# Patient Record
Sex: Male | Born: 1975 | Race: White | Hispanic: Yes | Marital: Married | Smoking: Never smoker
Health system: Southern US, Community
[De-identification: ages and names within clinical notes are randomized; demographics above are authoritative.]

## PROBLEM LIST (undated history)

## (undated) DIAGNOSIS — E785 Hyperlipidemia, unspecified: Secondary | ICD-10-CM

## (undated) HISTORY — DX: Hyperlipidemia, unspecified: E78.5

---

## 2009-04-12 ENCOUNTER — Emergency Department (HOSPITAL_COMMUNITY): Admission: EM | Admit: 2009-04-12 | Discharge: 2009-04-12 | Payer: Self-pay | Admitting: Emergency Medicine

## 2009-04-13 ENCOUNTER — Emergency Department (HOSPITAL_COMMUNITY): Admission: EM | Admit: 2009-04-13 | Discharge: 2009-04-13 | Payer: Self-pay | Admitting: Emergency Medicine

## 2012-02-17 ENCOUNTER — Encounter (HOSPITAL_COMMUNITY): Payer: Self-pay | Admitting: Emergency Medicine

## 2012-02-17 ENCOUNTER — Emergency Department (HOSPITAL_COMMUNITY): Payer: Self-pay

## 2012-02-17 ENCOUNTER — Emergency Department (HOSPITAL_COMMUNITY)
Admission: EM | Admit: 2012-02-17 | Discharge: 2012-02-17 | Disposition: A | Discharge status: Home / Self Care | Payer: Self-pay | Attending: Emergency Medicine | Admitting: Emergency Medicine

## 2012-02-17 DIAGNOSIS — S61209A Unspecified open wound of unspecified finger without damage to nail, initial encounter: Secondary | ICD-10-CM | POA: Insufficient documentation

## 2012-02-17 DIAGNOSIS — T148XXA Other injury of unspecified body region, initial encounter: Secondary | ICD-10-CM

## 2012-02-17 DIAGNOSIS — W278XXA Contact with other nonpowered hand tool, initial encounter: Secondary | ICD-10-CM | POA: Insufficient documentation

## 2012-02-17 MED ORDER — CEPHALEXIN 500 MG PO CAPS: 500.0000 mg | ORAL_CAPSULE | Freq: Three times a day (TID) | ORAL | Status: AC

## 2012-02-17 NOTE — ED Notes (Signed)
Pt d/c home. Verbalized understanding of need to complete full dose of antibiotics and warning sings indicating need to seek further treatment.  Respirations even and regular. Skin warm, dry, and intact. A.o. X4. Ambulatory. NAD. No further questions at this time. Family at bedside.

## 2012-02-17 NOTE — ED Provider Notes (Signed)
Medical screening examination/treatment/procedure(s) were performed by non-physician practitioner and as supervising physician I was immediately available for consultation/collaboration.  Larry Alcock K Naomia Lenderman, MD 02/17/12 0644 

## 2012-02-17 NOTE — ED Notes (Signed)
Pt reports about 5pm was working with nail gun and it shot his L middle finger; nail no longer in finger; pt reports just punctured tip of finger; pt now pain at site

## 2012-02-17 NOTE — ED Provider Notes (Signed)
History     CSN: [id number]  Arrival date & time 02/17/12  0018   First MD Initiated Contact with Patient 02/17/12 0300      Chief Complaint  Patient presents with  . Finger Injury    (Consider location/radiation/quality/duration/timing/severity/associated sxs/prior treatment) HPI Comments: Patient presents emergency department with chief complaint of left middle finger pain.  Onset of pain began approximately at 5 p.m., patient states that he accidentally shot his finger pad with a nail gun and reports pain at site and patient reports swelling, erythema, and pain at the distal tip of his digit gradually worsening since the incident.  Patient denies fever, night sweats, chills.  No other complaints this time.tetanus status up-to-date  The history is provided by the patient.    History reviewed. No pertinent past medical history.  History reviewed. No pertinent past surgical history.  History reviewed. No pertinent family history.  History  Substance Use Topics  . Smoking status: Never Smoker   . Smokeless tobacco: Not on file  . Alcohol Use: No      Review of Systems  Constitutional: Negative for fever, diaphoresis and activity change.  HENT: Negative for congestion and neck pain.   Respiratory: Negative for cough.   Genitourinary: Negative for dysuria.  Musculoskeletal: Negative for myalgias.  Skin: Positive for wound. Negative for color change.  Neurological: Negative for headaches.  All other systems reviewed and are negative.    Allergies  Review of patient's allergies indicates no known allergies.  Home Medications  No current outpatient prescriptions on file.  BP 117/66  Pulse 62  Temp 98 F (36.7 C) (Oral)  Resp 16  SpO2 98%  Physical Exam  Nursing note and vitals reviewed. Constitutional: He is oriented to person, place, and time. He appears well-developed and well-nourished. No distress.  HENT:  Head: Normocephalic and atraumatic.  Eyes:  Conjunctivae and EOM are normal.  Neck: Normal range of motion.  Cardiovascular:       Intact distal pulses, cap refill < 3 sec  Pulmonary/Chest: Effort normal.  Musculoskeletal: Normal range of motion.       Mild pain w flexion extension of DIP of L 3rd digit.   Neurological: He is alert and oriented to person, place, and time.  Skin: Skin is warm and dry. No rash noted. He is not diaphoretic.       3 digit left hand swollen without erythema or purulent draining. No streaking up arm. .5 cm puncture wound at distal tip palmer surface.   Psychiatric: He has a normal mood and affect. His behavior is normal.    ED Course  Procedures (including critical care time)  Labs Reviewed - No data to display Dg Finger Middle Left  02/17/2012  *RADIOLOGY REPORT*  Clinical Data: 8/5 injury  LEFT MIDDLE FINGER 2+V  Comparison: 04/12/2009  Findings: No radiodense foreign body or subcutaneous gas. Negative for fracture, dislocation, or other acute abnormality.  Normal alignment and mineralization. No significant degenerative change. Regional soft tissues unremarkable.  IMPRESSION:  Negative  Original Report Authenticated By: Osa Craver, M.D.     No diagnosis found.    MDM  Puncture wound  Tdap up to date. Image reviewed, no fracture. Digit tender and swollen at puncture site. Return precautions discussed. Wound irrigated. No signs of infection. Advised PCP f-u for wound check.          Jaci Carrel, [place] 02/17/12 [phone]

## 2012-02-17 NOTE — ED Notes (Addendum)
Pt reports shooting a nail into his middle finger pf his left with a nail gun at approx 1700 yesterday. Reports pain 8/10. Numbness earlier this morning. None right now. Tingling in middle. Denies any other symptoms. A.O. X 4. NAD. Sitting up in bed. Family at bedside.

## 2013-02-05 ENCOUNTER — Encounter (HOSPITAL_COMMUNITY): Payer: Self-pay

## 2013-02-05 ENCOUNTER — Emergency Department (INDEPENDENT_AMBULATORY_CARE_PROVIDER_SITE_OTHER)
Admission: EM | Admit: 2013-02-05 | Discharge: 2013-02-05 | Disposition: A | Discharge status: Home / Self Care | Payer: Self-pay | Source: Home / Self Care

## 2013-02-05 DIAGNOSIS — B372 Candidiasis of skin and nail: Secondary | ICD-10-CM

## 2013-02-05 DIAGNOSIS — K2970 Gastritis, unspecified, without bleeding: Secondary | ICD-10-CM

## 2013-02-05 DIAGNOSIS — L089 Local infection of the skin and subcutaneous tissue, unspecified: Secondary | ICD-10-CM

## 2013-02-05 LAB — POCT URINALYSIS DIP (DEVICE)
Bilirubin Urine: NEGATIVE
Hgb urine dipstick: NEGATIVE
Ketones, ur: NEGATIVE mg/dL
Leukocytes, UA: NEGATIVE
Protein, ur: NEGATIVE mg/dL
Specific Gravity, Urine: 1.015 (ref 1.005–1.030)
Urobilinogen, UA: 0.2 mg/dL (ref 0.0–1.0)

## 2013-02-05 MED ORDER — OMEPRAZOLE 20 MG PO CPDR: 20.0000 mg | DELAYED_RELEASE_CAPSULE | Freq: Two times a day (BID) | ORAL | Status: DC

## 2013-02-05 MED ORDER — CEPHALEXIN 500 MG PO CAPS: 500.0000 mg | ORAL_CAPSULE | Freq: Four times a day (QID) | ORAL | Status: DC

## 2013-02-05 MED ORDER — CLOTRIMAZOLE-BETAMETHASONE 1-0.05 % EX CREA: TOPICAL_CREAM | CUTANEOUS | Status: DC

## 2013-02-05 NOTE — ED Notes (Signed)
Reports abdominal pain x 1 week, w "water draining from belly button" ; observed facial expression change when went from laying to sitting position. C/o nausea, but no vomiting; NAD at present

## 2013-02-05 NOTE — ED Provider Notes (Signed)
CSN: [id number]     Arrival date & time 02/05/13  1744 History     First MD Initiated Contact with Patient 02/05/13 1829     Chief Complaint  Patient presents with  . Abdominal Pain   (Consider location/radiation/quality/duration/timing/severity/associated sxs/prior Treatment) HPI Comments: 37 year old spending male who is complaining of stomach pains for the past 4 days. He states that his abdomen hurts when he bends over. He is complaining of a "ball"in the umbilicus. It is also itching and draining a slimy fluid. He is complaining of transient nausea without vomiting and felt as though he may have had a fever but not measured. He is having bowel movements about 4 times a day. Is also having malodorous urine without dysuria or frequency.  Patient is a 37 y.o. male presenting with abdominal pain.  Abdominal Pain Associated symptoms include abdominal pain. Pertinent negatives include no chest pain, no headaches and no shortness of breath.    History reviewed. No pertinent past medical history. History reviewed. No pertinent past surgical history. History reviewed. No pertinent family history. History  Substance Use Topics  . Smoking status: Never Smoker   . Smokeless tobacco: Not on file  . Alcohol Use: No    Review of Systems  Constitutional: Positive for fever. Negative for activity change and fatigue.  HENT: Negative.   Respiratory: Negative for cough, shortness of breath and wheezing.   Cardiovascular: Negative for chest pain and leg swelling.  Gastrointestinal: Positive for nausea and abdominal pain. Negative for vomiting, constipation and blood in stool.  Genitourinary: Negative for urgency, frequency, hematuria, flank pain, discharge, difficulty urinating, penile pain and testicular pain.  Musculoskeletal: Negative.   Neurological: Negative for dizziness, seizures, syncope, speech difficulty and headaches.  Psychiatric/Behavioral: Negative.     Allergies  Review of  patient's allergies indicates no known allergies.  Home Medications   Current Outpatient Rx  Name  Route  Sig  Dispense  Refill  . cephALEXin (KEFLEX) 500 MG capsule   Oral   Take 1 capsule (500 mg total) by mouth 4 (four) times daily.   28 capsule   0   . clotrimazole-betamethasone (LOTRISONE) cream      Apply to affected area 2 times daily prn   15 g   0   . omeprazole (PRILOSEC) 20 MG capsule   Oral   Take 1 capsule (20 mg total) by mouth 2 (two) times daily. X 10 days   40 capsule   0    BP 125/90  Pulse 60  Temp(Src) 98.8 F (37.1 C) (Oral)  Resp 18  SpO2 98% Physical Exam  Nursing note and vitals reviewed. Constitutional: He is oriented to person, place, and time. He appears well-developed and well-nourished. No distress.  HENT:  Mouth/Throat: Oropharynx is clear and moist. No oropharyngeal exudate.  Eyes: Conjunctivae and EOM are normal. Pupils are equal, round, and reactive to light.  Neck: Normal range of motion. Neck supple.  Cardiovascular: Normal rate and normal heart sounds.   Pulmonary/Chest: Effort normal and breath sounds normal. No respiratory distress. He has no wheezes. He has no rales.  Abdominal: Soft. Bowel sounds are normal. He exhibits no distension and no mass. There is no rebound and no guarding.  There is minor tenderness in the epigastrium. There is mild tenderness over the umbilicus. The "ball" is actually a thickening of approximately 4mm of the dermis/SQ in the wall of the umbilicus. There is no overlying erythema or color changes. No overlying swelling. There is  scant moisture within the umbilicus. It is tender. No bulging or palpable muscular ring. No evidence of hernia.  Musculoskeletal: He exhibits no edema and no tenderness.  Lymphadenopathy:    He has no cervical adenopathy.  Neurological: He is alert and oriented to person, place, and time. He exhibits normal muscle tone.  Skin: Skin is warm and dry.  Psychiatric: He has a normal  mood and affect.    ED Course   Procedures (including critical care time)  Labs Reviewed  POCT URINALYSIS DIP (DEVICE)   No results found. 1. Candidal dermatitis   2. Local skin infection   3. Gastritis     MDM  Urinalysis is clear. Abdominal exam essentially benign with the exception of mild tenderness in the epigastrium and the umbilical Candida infection. There also may be an associated localized bacterial infection in the wall of the umbilicus probably separate from the Candida infection. Lotrisone cream applied to the umbilicus twice a day Keflex 500 mg 4 times a day for 7 days Omeprazole 40 mg by mouth daily for 2 weeks. Cole[facility] number above to obtain a PCP.  Hayden Rasmussen, NP 02/05/13 1945

## 2013-02-05 NOTE — ED Provider Notes (Signed)
Medical screening examination/treatment/procedure(s) were performed by non-physician practitioner and as supervising physician I was immediately available for consultation/collaboration.  Leslee Home, M.D.  Reuben Likes, MD 02/05/13 2027

## 2014-03-09 ENCOUNTER — Emergency Department (HOSPITAL_COMMUNITY)
Admission: EM | Admit: 2014-03-09 | Discharge: 2014-03-09 | Disposition: A | Discharge status: Home / Self Care | Payer: Self-pay | Attending: Emergency Medicine | Admitting: Emergency Medicine

## 2014-03-09 ENCOUNTER — Emergency Department (HOSPITAL_COMMUNITY): Payer: Self-pay

## 2014-03-09 ENCOUNTER — Encounter (HOSPITAL_COMMUNITY): Payer: Self-pay | Admitting: Emergency Medicine

## 2014-03-09 DIAGNOSIS — Z23 Encounter for immunization: Secondary | ICD-10-CM | POA: Insufficient documentation

## 2014-03-09 DIAGNOSIS — W278XXA Contact with other nonpowered hand tool, initial encounter: Secondary | ICD-10-CM | POA: Insufficient documentation

## 2014-03-09 DIAGNOSIS — Z792 Long term (current) use of antibiotics: Secondary | ICD-10-CM | POA: Insufficient documentation

## 2014-03-09 DIAGNOSIS — S61209A Unspecified open wound of unspecified finger without damage to nail, initial encounter: Secondary | ICD-10-CM | POA: Insufficient documentation

## 2014-03-09 DIAGNOSIS — Y9289 Other specified places as the place of occurrence of the external cause: Secondary | ICD-10-CM | POA: Insufficient documentation

## 2014-03-09 DIAGNOSIS — S61315A Laceration without foreign body of left ring finger with damage to nail, initial encounter: Secondary | ICD-10-CM

## 2014-03-09 DIAGNOSIS — Z79899 Other long term (current) drug therapy: Secondary | ICD-10-CM | POA: Insufficient documentation

## 2014-03-09 DIAGNOSIS — Y9389 Activity, other specified: Secondary | ICD-10-CM | POA: Insufficient documentation

## 2014-03-09 DIAGNOSIS — IMO0002 Reserved for concepts with insufficient information to code with codable children: Secondary | ICD-10-CM | POA: Insufficient documentation

## 2014-03-09 MED ORDER — TETANUS-DIPHTH-ACELL PERTUSSIS 5-2.5-18.5 LF-MCG/0.5 IM SUSP
0.5000 mL | Freq: Once | INTRAMUSCULAR | Status: AC
  Administered 2014-03-09: 0.5 mL via INTRAMUSCULAR
  Filled 2014-03-09: qty 0.5

## 2014-03-09 MED ORDER — OXYCODONE-ACETAMINOPHEN 5-325 MG PO TABS
1.0000 | ORAL_TABLET | Freq: Once | ORAL | Status: AC
  Administered 2014-03-09: 1 via ORAL
  Filled 2014-03-09: qty 1

## 2014-03-09 NOTE — ED Notes (Signed)
Declined W/C at D/C and was escorted to lobby by RN. 

## 2014-03-09 NOTE — ED Notes (Signed)
Pt rports cuttin LT ring finger on a saw today.

## 2014-03-09 NOTE — ED Provider Notes (Signed)
CSN: [id number]     Arrival date & time 03/09/14  1823 History   This chart was scribed for a non-physician practitioner, Emilia Beck, PA-C working with Glynn Octave, MD by [place] Peace, ED Scribe. The patient was seen in TR11C/TR11C. The patient's care was started at 7:08 PM.      Chief Complaint  Patient presents with  . Laceration     Patient is a 38 y.o. male presenting with skin laceration. The history is provided by the patient. No language interpreter was used.  Laceration  HPI Comments: Allen Mendez is a 38 y.o. male who presents to the Emergency Department complaining of laceration left ring finger onset earlier today that occurred while pt was cutting wood with a saw and sliced the tip of his finger on it. Pt's last tetanus shot was unknown but received one today after arrival. Pt is being sent for x-ray to rule out possible fracture.      History reviewed. No pertinent past medical history. History reviewed. No pertinent past surgical history. History reviewed. No pertinent family history. History  Substance Use Topics  . Smoking status: Never Smoker   . Smokeless tobacco: Not on file  . Alcohol Use: No    Review of Systems  Skin: Positive for wound.  All other systems reviewed and are negative.     Allergies  Review of patient's allergies indicates no known allergies.  Home Medications   Prior to Admission medications   Medication Sig Start Date End Date Taking? Authorizing Provider  cephALEXin (KEFLEX) 500 MG capsule Take 1 capsule (500 mg total) by mouth 4 (four) times daily. 02/05/13  Yes Hayden Rasmussen, NP  clotrimazole-betamethasone (LOTRISONE) cream Apply to affected area 2 times daily prn 02/05/13  Yes Hayden Rasmussen, NP  omeprazole (PRILOSEC) 20 MG capsule Take 1 capsule (20 mg total) by mouth 2 (two) times daily. X 10 days 02/05/13  Yes Hayden Rasmussen, NP   BP 133/78  Pulse 67  Temp(Src) 97.8 F (36.6 C) (Oral)  Resp 20  SpO2 100% Physical Exam   Nursing note and vitals reviewed. Constitutional: He is oriented to person, place, and time. He appears well-developed and well-nourished. No distress.  HENT:  Head: Normocephalic and atraumatic.  Eyes: Conjunctivae and EOM are normal.  Neck: Neck supple. No tracheal deviation present.  Cardiovascular: Normal rate.   Pulmonary/Chest: Effort normal. No respiratory distress.  Musculoskeletal: Normal range of motion.  No obvious deformity. Full ROM of left ring finger.   Neurological: He is alert and oriented to person, place, and time.  Skin: Skin is warm and dry.  Jagged 1.5 cm laceration to tip of left ring finger that penetrates corner of the nail.   Psychiatric: He has a normal mood and affect. His behavior is normal.    ED Course  NERVE BLOCK Date/Time: 03/10/2014 2:45 AM Performed by: Emilia Beck Authorized by: Emilia Beck Consent: Verbal consent obtained. Risks and benefits: risks, benefits and alternatives were discussed Consent given by: patient Patient understanding: patient states understanding of the procedure being performed Patient consent: the patient's understanding of the procedure matches consent given Patient identity confirmed: verbally with patient Indications: pain relief and extensive wound Body area: upper extremity Nerve: digital Laterality: left Patient sedated: no Preparation: Patient was prepped and draped in the usual sterile fashion. Patient position: sitting Needle gauge: 25 G Location technique: anatomical landmarks Local anesthetic: lidocaine 1% without epinephrine Anesthetic total: 4 ml Outcome: pain improved Patient tolerance: Patient tolerated the procedure well  with no immediate complications.   (including critical care time)  LACERATION REPAIR Performed by: Emilia Beck Authorized by: Emilia Beck Consent: Verbal consent obtained. Risks and benefits: risks, benefits and alternatives were discussed Consent  given by: patient Patient identity confirmed: provided demographic data Prepped and Draped in normal sterile fashion Wound explored  Laceration Location: distal left ring finger  Laceration Length: 1.5 cm  No Foreign Bodies seen or palpated  Anesthesia: local infiltration  Local anesthetic: lidocaine 1% without epinephrine  Anesthetic total: 4 ml  Irrigation method: syringe Amount of cleaning: standard  Skin closure: 4-0 prolene  Number of sutures: 3  Technique: simple  Patient tolerance: Patient tolerated the procedure well with no immediate complications.   Labs Review Labs Reviewed - No data to display  Results for orders placed during the hospital encounter of 02/05/13  POCT URINALYSIS DIP (DEVICE)      Result Value Ref Range   Glucose, UA NEGATIVE  NEGATIVE mg/dL   Bilirubin Urine NEGATIVE  NEGATIVE   Ketones, ur NEGATIVE  NEGATIVE mg/dL   Specific Gravity, Urine 1.015  1.005 - 1.030   Hgb urine dipstick NEGATIVE  NEGATIVE   pH 6.5  5.0 - 8.0   Protein, ur NEGATIVE  NEGATIVE mg/dL   Urobilinogen, UA 0.2  0.0 - 1.0 mg/dL   Nitrite NEGATIVE  NEGATIVE   Leukocytes, UA NEGATIVE  NEGATIVE   No results found.    Imaging Review Dg Finger Ring Left  03/09/2014   CLINICAL DATA:  Left ring finger laceration.  EXAM: LEFT RING FINGER 2+V  COMPARISON:  02/17/2012  FINDINGS: Soft tissue irregularity/laceration involving the tip of the fourth digit. No displaced fracture or dislocation. No aggressive osseous lesion or overt degenerative change. No radiopaque foreign body.  IMPRESSION: Soft tissue irregularity/laceration involving the tip of the fourth digit left hand.  No acute osseous finding or radiopaque foreign body.   Electronically Signed   By: Jearld Lesch M.D.   On: 03/09/2014 21:15     EKG Interpretation None     Medications  Tdap (BOOSTRIX) injection 0.5 mL (0.5 mLs Intramuscular Given 03/09/14 1843)  oxyCODONE-acetaminophen (PERCOCET/ROXICET) 5-325 MG  per tablet 1 tablet (1 tablet Oral Given 03/09/14 1842)   7:10 PM- Treatment plan was discussed with patient who verbalizes understanding and agrees.   MDM   Final diagnoses:  Laceration of left ring finger with damage to nail w/o foreign body, initial encounter    10:51 PM Laceration repaired without difficulty. Small portion of nail removed. Patient instructed to keep wound clean and return in 10 days for suture removal.   I personally performed the services described in this documentation, which was scribed in my presence. The recorded information has been reviewed and is accurate.    Emilia Beck, PA-C 03/10/14 0246

## 2014-03-09 NOTE — ED Notes (Signed)
Patient transported to X-ray 

## 2014-03-09 NOTE — Discharge Instructions (Signed)
Keep wound clean. Return to the ED in 10 days for suture removal. Refer to attached documents for more information.  °

## 2014-03-10 NOTE — ED Provider Notes (Signed)
Medical screening examination/treatment/procedure(s) were performed by non-physician practitioner and as supervising physician I was immediately available for consultation/collaboration.   EKG Interpretation None       Glynn Octave, MD 03/10/14 [phone]

## 2014-03-21 ENCOUNTER — Encounter (HOSPITAL_COMMUNITY): Payer: Self-pay | Admitting: Emergency Medicine

## 2014-03-21 ENCOUNTER — Emergency Department (HOSPITAL_COMMUNITY)
Admission: EM | Admit: 2014-03-21 | Discharge: 2014-03-21 | Disposition: A | Discharge status: Home / Self Care | Payer: Self-pay | Attending: Emergency Medicine | Admitting: Emergency Medicine

## 2014-03-21 DIAGNOSIS — Z4802 Encounter for removal of sutures: Secondary | ICD-10-CM | POA: Insufficient documentation

## 2014-03-21 NOTE — ED Provider Notes (Signed)
CSN: [id number]     Arrival date & time 03/21/14  1957 History  This chart was scribed for non-physician practitioner, Rhea Bleacher, PA-C working with Glynn Octave, MD by Greggory Stallion, ED scribe. This patient was seen in room TR07C/TR07C and the patient's care was started at 9:34 PM.   Chief Complaint  Patient presents with  . Suture / Staple Removal   The history is provided by the patient. No language interpreter was used.   HPI Comments: Allen Mendez is a 38 y.o. male who presents to the Emergency Department for suture removal. Pt had 4 sutures placed to the tip of his right middle finger 12 days ago after he accidentally cut it with a saw. Denies any redness or drainage around the wound.   History reviewed. No pertinent past medical history. History reviewed. No pertinent past surgical history. No family history on file. History  Substance Use Topics  . Smoking status: Never Smoker   . Smokeless tobacco: Not on file  . Alcohol Use: No    Review of Systems  Constitutional: Negative for fever.  HENT: Negative for congestion.   Eyes: Negative for redness.  Respiratory: Negative for shortness of breath.   Cardiovascular: Negative for chest pain.  Gastrointestinal: Negative for abdominal distention.  Musculoskeletal: Negative for gait problem.  Skin: Positive for wound. Negative for color change.  Neurological: Negative for speech difficulty.  Psychiatric/Behavioral: Negative for confusion.   Allergies  Review of patient's allergies indicates no known allergies.  Home Medications   Prior to Admission medications   Not on File   BP 119/76  Pulse 102  Temp(Src) 97.8 F (36.6 C) (Oral)  Resp 16  Wt 150 lb (68.04 kg)  SpO2 99%  Physical Exam  Nursing note and vitals reviewed. Constitutional: He is oriented to person, place, and time. He appears well-developed and well-nourished. No distress.  HENT:  Head: Normocephalic and atraumatic.  Eyes: Conjunctivae  and EOM are normal.  Neck: Neck supple. No tracheal deviation present.  Cardiovascular: Normal rate.   Pulmonary/Chest: Effort normal. No respiratory distress.  Musculoskeletal: Normal range of motion.  Neurological: He is alert and oriented to person, place, and time.  Skin: Skin is warm and dry.  4 Prolene sutures in place at tip of left fourth digit. No signs of infection. Full range of motion of finger and hand.  Psychiatric: He has a normal mood and affect. His behavior is normal.    ED Course  Procedures (including critical care time)  DIAGNOSTIC STUDIES: Oxygen Saturation is 99% on RA, normal by my interpretation.    COORDINATION OF CARE: 9:35 PM-Discussed treatment plan which includes suture removal with pt at bedside and pt agreed to plan.   Labs Review Labs Reviewed - No data to display  Imaging Review No results found.   EKG Interpretation None      SUTURE REMOVAL Performed by: Marca Ancona PA-S  Consent: Verbal consent obtained. Patient identity confirmed: provided demographic data Time out: Immediately prior to procedure a "time out" was called to verify the correct patient, procedure, equipment, support staff and site/side marked as required.  Location details: L 4th digit  Wound Appearance: clean  Sutures/Staples Removed: 4  Facility: sutures placed in this facility Patient tolerance: Patient tolerated the procedure well with no immediate complications.  Patient urged to return with worsening symptoms or other concerns. Patient verbalized understanding and agrees with plan.     MDM   Final diagnoses:  Visit for suture removal  Sutures removed without complication. No signs of infection.  I personally performed the services described in this documentation, which was scribed in my presence. The recorded information has been reviewed and is accurate.  Renne Crigler, PA-C 03/21/14 2141

## 2014-03-21 NOTE — ED Provider Notes (Signed)
Medical screening examination/treatment/procedure(s) were performed by non-physician practitioner and as supervising physician I was immediately available for consultation/collaboration.   EKG Interpretation None        Glynn Octave, MD 03/21/14 2326

## 2014-03-21 NOTE — Discharge Instructions (Signed)
Please read and follow all provided instructions.  Your diagnoses today include:  1. Visit for suture removal    Tests performed today include:  Vital signs. See below for your results today.   Medications prescribed:   None  Home care instructions:  Follow any educational materials contained in this packet.  Follow-up instructions: Please follow-up with your primary care provider as needed for further evaluation of your symptoms.   Return instructions:   Please return to the Emergency Department if you experience worsening symptoms.   Please return if you have any other emergent concerns.  Additional Information:  Your vital signs today were: BP 119/76   Pulse 102   Temp(Src) 97.8 F (36.6 C) (Oral)   Resp 16   Wt 150 lb (68.04 kg)   SpO2 99% If your blood pressure (BP) was elevated above 135/85 this visit, please have this repeated by your doctor within one month. ---------------

## 2014-03-21 NOTE — ED Notes (Signed)
Pt here for stitch removal from left ring finger. Pt reports proper healing. Denies fever/chills. NAD.

## 2015-03-21 ENCOUNTER — Encounter (HOSPITAL_COMMUNITY): Payer: Self-pay | Admitting: Emergency Medicine

## 2015-03-21 ENCOUNTER — Emergency Department (HOSPITAL_COMMUNITY)
Admission: EM | Admit: 2015-03-21 | Discharge: 2015-03-21 | Disposition: A | Discharge status: Home / Self Care | Payer: Self-pay | Attending: Emergency Medicine | Admitting: Emergency Medicine

## 2015-03-21 ENCOUNTER — Emergency Department (HOSPITAL_COMMUNITY): Payer: Self-pay

## 2015-03-21 DIAGNOSIS — Y9389 Activity, other specified: Secondary | ICD-10-CM | POA: Insufficient documentation

## 2015-03-21 DIAGNOSIS — W450XXA Nail entering through skin, initial encounter: Secondary | ICD-10-CM | POA: Insufficient documentation

## 2015-03-21 DIAGNOSIS — Y92009 Unspecified place in unspecified non-institutional (private) residence as the place of occurrence of the external cause: Secondary | ICD-10-CM | POA: Insufficient documentation

## 2015-03-21 DIAGNOSIS — Y999 Unspecified external cause status: Secondary | ICD-10-CM | POA: Insufficient documentation

## 2015-03-21 DIAGNOSIS — S61432A Puncture wound without foreign body of left hand, initial encounter: Secondary | ICD-10-CM | POA: Insufficient documentation

## 2015-03-21 MED ORDER — CEPHALEXIN 250 MG PO CAPS
1000.0000 mg | ORAL_CAPSULE | Freq: Once | ORAL | Status: AC
  Administered 2015-03-21: 1000 mg via ORAL
  Filled 2015-03-21: qty 4

## 2015-03-21 MED ORDER — HYDROCODONE-ACETAMINOPHEN 5-325 MG PO TABS: 1.0000 | ORAL_TABLET | Freq: Four times a day (QID) | ORAL | Status: DC | PRN

## 2015-03-21 MED ORDER — CEPHALEXIN 500 MG PO CAPS: 500.0000 mg | ORAL_CAPSULE | Freq: Four times a day (QID) | ORAL | Status: DC

## 2015-03-21 MED ORDER — HYDROCODONE-ACETAMINOPHEN 5-325 MG PO TABS
1.0000 | ORAL_TABLET | Freq: Once | ORAL | Status: AC
  Administered 2015-03-21: 1 via ORAL
  Filled 2015-03-21: qty 1

## 2015-03-21 NOTE — Discharge Instructions (Signed)
Puncture Wound °A puncture wound is an injury that extends through all layers of the skin and into the tissue beneath the skin (subcutaneous tissue). Puncture wounds become infected easily because germs often enter the body and go beneath the skin during the injury. Having a deep wound with a small entrance point makes it difficult for your caregiver to adequately clean the wound. This is especially true if you have stepped on a nail and it has passed through a dirty shoe or other situations where the wound is obviously contaminated. °CAUSES  °Many puncture wounds involve glass, nails, splinters, fish hooks, or other objects that enter the skin (foreign bodies). A puncture wound may also be caused by a human bite or animal bite. °DIAGNOSIS  °A puncture wound is usually diagnosed by your history and a physical exam. You may need to have an X-ray or an ultrasound to check for any foreign bodies still in the wound. °TREATMENT  °· Your caregiver will clean the wound as thoroughly as possible. Depending on the location of the wound, a bandage (dressing) may be applied. °· Your caregiver might prescribe antibiotic medicines. °· You may need a follow-up visit to check on your wound. Follow all instructions as directed by your caregiver. °HOME CARE INSTRUCTIONS  °· Change your dressing once per day, or as directed by your caregiver. If the dressing sticks, it may be removed by soaking the area in water. °· If your caregiver has given you follow-up instructions, it is very important that you return for a follow-up appointment. Not following up as directed could result in a chronic or permanent injury, pain, and disability. °· Only take over-the-counter or prescription medicines for pain, discomfort, or fever as directed by your caregiver. °· If you are given antibiotics, take them as directed. Finish them even if you start to feel better. °You may need a tetanus shot if: °· You cannot remember when you had your last tetanus  shot. °· You have never had a tetanus shot. °If you got a tetanus shot, your arm may swell, get red, and feel warm to the touch. This is common and not a problem. If you need a tetanus shot and you choose not to have one, there is a rare chance of getting tetanus. Sickness from tetanus can be serious. °You may need a rabies shot if an animal bite caused your puncture wound. °SEEK MEDICAL CARE IF:  °· You have redness, swelling, or increasing pain in the wound. °· You have red streaks going away from the wound. °· You notice a bad smell coming from the wound or dressing. °· You have yellowish-white fluid (pus) coming from the wound. °· You are treated with an antibiotic for infection, but the infection is not getting better. °· You notice something in the wound, such as rubber from your shoe, cloth, or another object. °· You have a fever. °· You have severe pain. °· You have difficulty breathing. °· You feel dizzy or faint. °· You cannot stop vomiting. °· You lose feeling, develop numbness, or cannot move a limb below the wound. °· Your symptoms worsen. °MAKE SURE YOU: °· Understand these instructions. °· Will watch your condition. °· Will get help right away if you are not doing well or get worse. °Document Released: 04/02/2005 Document Revised: 09/15/2011 Document Reviewed: 12/10/2010 °[employer]® Patient Information ©2015 [employer]. This information is not intended to replace advice given to you by your health care provider. Make sure you discuss any questions you   have with your health care provider. ° °

## 2015-03-21 NOTE — ED Notes (Signed)
Pt. accidentally hit with a nail at left hand this evening , pt. pulled the nail out prior to arrival , swelling with no bleeding at arrival .

## 2015-03-21 NOTE — ED Provider Notes (Signed)
CSN: [id number]     Arrival date & time 03/21/15  2032 History  This chart was scribed for Fayrene Helper, PA-C, working with Donnetta Hutching, MD by Chestine Spore, ED Scribe. The patient was seen in room TR08C/TR08C at 10:24 PM.    Chief Complaint  Patient presents with  . Hand Injury     The history is provided by the patient. No language interpreter was used.    Allen Mendez is a 39 y.o. male who presents to the Emergency Department complaining of left hand injury onset this evening. He reports that he accidentally hit himself with a nail while replacing the steps in his house. Pt notes that he pulled the nail out PTA. Pt rates his pain as 8/10 and he has not tried any medications for the relief of his symptoms. Pt is right hand dominant. Pt note sthat his last tetanus shot was 2 years ago. Pt is having associated symptoms of joint swelling. He denies color change, rash, and any other symptoms. Denies allergies to medications.   History reviewed. No pertinent past medical history. History reviewed. No pertinent past surgical history. No family history on file. Social History  Substance Use Topics  . Smoking status: Never Smoker   . Smokeless tobacco: None  . Alcohol Use: No    Review of Systems  Musculoskeletal: Positive for joint swelling and arthralgias.  Skin: Positive for wound. Negative for color change.      Allergies  Review of patient's allergies indicates no known allergies.  Home Medications   Prior to Admission medications   Not on File   BP 116/77 mmHg  Pulse 58  Temp(Src) 98.1 F (36.7 C) (Oral)  Resp 16  Ht  (1.727 m)  Wt 148 lb (67.132 kg)  BMI 22.51 kg/m2  SpO2 98% Physical Exam  Constitutional: He is oriented to person, place, and time. He appears well-developed and well-nourished. No distress.  HENT:  Head: Normocephalic and atraumatic.  Eyes: EOM are normal.  Neck: Neck supple.  Cardiovascular: Normal rate.   Pulmonary/Chest: Effort  normal. No respiratory distress.  Abdominal: Soft. There is no tenderness.  Musculoskeletal: Normal range of motion.  Left hand: palms of hand with puncture wound to the medial aspect of hand involving the hypothenar and an exit wound to the palmar aspect involving the thenar muscle. No foreign object noted. decreased flexion to pinky secondary to pain. Palm is mildly edematous without crepitus.   Neurological: He is alert and oriented to person, place, and time.  Skin: Skin is warm and dry.  Psychiatric: He has a normal mood and affect. His behavior is normal.  Nursing note and vitals reviewed.   ED Course  Procedures (including critical care time) DIAGNOSTIC STUDIES: Oxygen Saturation is 98% on RA, nl by my interpretation.    COORDINATION OF CARE: 10:27 PM-I suspect that this is a non-infected puncture wound of the left hand. Will obtain a left hand xray to rule out fracture. Will treat patient with abx and referral to orthopedist. Discussed treatment plan with pt at bedside and pt agreed to plan.  10:55 PM- Consult with Dr. Mina Marble about pt and he recommended tetanus updated, abx Rx, and for the pt to f/u in office next week.  Imaging Review Dg Hand Complete Left  03/21/2015   CLINICAL DATA:  39 year old with left hand trauma and pain  EXAM: LEFT HAND - COMPLETE 3+ VIEW  [id number].  FINDINGS: There is no evidence of fracture or dislocation.  There is no evidence of arthropathy or other focal bone abnormality. Soft tissues are unremarkable.  IMPRESSION: Negative.   Electronically Signed   By: Elgie Collard M.D.   On: 03/21/2015 22:17   I have personally reviewed and evaluated these images as part of my medical decision-making.   MDM   Final diagnoses:  Puncture wound of left hand, initial encounter   BP 116/77 mmHg  Pulse 58  Temp(Src) 98.1 F (36.7 C) (Oral)  Resp 16  Ht 5\' 8"  (1.727 m)  Wt 148 lb (67.132 kg)  BMI 22.51 kg/m2  SpO2 98%   I personally performed  the services described in this documentation, which was scribed in my presence. The recorded information has been reviewed and is accurate.     Fayrene Helper, PA-C 03/21/15 1610  Donnetta Hutching, MD 03/22/15 [phone]

## 2017-01-11 ENCOUNTER — Ambulatory Visit (HOSPITAL_COMMUNITY)
Admission: EM | Admit: 2017-01-11 | Discharge: 2017-01-11 | Disposition: A | Discharge status: Home / Self Care | Payer: Self-pay | Attending: Family Medicine | Admitting: Family Medicine

## 2017-01-11 ENCOUNTER — Encounter (HOSPITAL_COMMUNITY): Payer: Self-pay | Admitting: Emergency Medicine

## 2017-01-11 DIAGNOSIS — L249 Irritant contact dermatitis, unspecified cause: Secondary | ICD-10-CM

## 2017-01-11 DIAGNOSIS — L509 Urticaria, unspecified: Secondary | ICD-10-CM

## 2017-01-11 MED ORDER — TRIAMCINOLONE ACETONIDE 0.1 % EX CREA: TOPICAL_CREAM | CUTANEOUS | 0 refills | Status: DC

## 2017-01-11 MED ORDER — PREDNISONE 50 MG PO TABS: ORAL_TABLET | ORAL | 0 refills | Status: DC

## 2017-01-11 MED ORDER — TRIAMCINOLONE ACETONIDE 40 MG/ML IJ SUSP
40.0000 mg | Freq: Once | INTRAMUSCULAR | Status: AC
  Administered 2017-01-11: 40 mg via INTRAMUSCULAR

## 2017-01-11 MED ORDER — TRIAMCINOLONE ACETONIDE 40 MG/ML IJ SUSP
INTRAMUSCULAR | Status: AC
  Filled 2017-01-11: qty 1

## 2017-01-11 NOTE — ED Provider Notes (Signed)
CSN: [id number]     Arrival date & time 01/11/17  1231 History   First MD Initiated Contact with Patient 01/11/17 1335     Chief Complaint  Patient presents with  . Rash   (Consider location/radiation/quality/duration/timing/severity/associated sxs/prior Treatment) 41 year old spending male is complaining of a rash to his arms and torso this started little over week ago. Some of the rash tends to come and go. The rash on the arms are more permanent. The rash on the back is worse with heat exposure. Denies problems with breathing, swelling. The rash does itch. His jobs in [employer]. He has to wear longsleeved uniforms. This also seems to exacerbate the rash. 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      History reviewed. No pertinent past medical history. History reviewed. No pertinent surgical history. History reviewed. No pertinent family history. Social History  Substance Use Topics  . Smoking status: Never Smoker  . Smokeless tobacco: Not on file  . Alcohol use No    Review of Systems  HENT: Negative.   Respiratory: Negative.  Negative for cough and shortness of breath.   Cardiovascular: Negative.   Gastrointestinal: Negative.   Skin: Positive for rash.  Neurological: Negative.   All other systems reviewed and are negative.   Allergies  Patient has no known allergies.  Home Medications   Prior to Admission medications   Medication Sig Start Date End Date Taking? Authorizing Provider  predniSONE (DELTASONE) 50 MG tablet 1 tab po daily for 6 days. Take with food. Start 01/12/17. 01/11/17   Hayden Rasmussen, NP  triamcinolone cream (KENALOG) 0.1 % One application to arm rash bid 01/11/17   Hayden Rasmussen, NP   Meds Ordered and Administered this Visit   Medications  triamcinolone acetonide (KENALOG-40) injection 40 mg (not administered)    BP 107/64 (BP Location: Left Arm)   Pulse (!) 51   Temp  98.4 F (36.9 C) (Oral)   Resp 16   SpO2 99%  No data found.   Physical Exam  Constitutional: He is oriented to person, place, and time. He appears well-developed and well-nourished. No distress.  HENT:  Nose: Nose normal.  Mouth/Throat: Oropharynx is clear and moist.  Eyes: EOM are normal.  Neck: Normal range of motion. Neck supple.  Cardiovascular: Normal rate.   Pulmonary/Chest: Effort normal and breath sounds normal. No respiratory distress.  Musculoskeletal: He exhibits no edema.  Neurological: He is alert and oriented to person, place, and time. He exhibits normal muscle tone.  Skin: Skin is warm and dry. Rash noted.  Fine papular vesicular rash to the bilateral antecubital fossa is and over the biceps and forearms. The torso with urticarial lesions.  Psychiatric: He has a normal mood and affect.  Nursing note and vitals reviewed.   Urgent Care Course     Procedures (including critical care time)  Labs Review Labs Reviewed - No data to display  Imaging Review No results found.   Visual Acuity Review  Right Eye Distance:   Left Eye Distance:   Bilateral Distance:    Right Eye Near:   Left Eye Near:    Bilateral Near:         MDM   1. Hives   2. Irritant contact dermatitis, unspecified trigger    It appears you have to different types of rashes. One is called hives or urticaria and the other one is  called contact dermatitis. It may be something you are exposed to at work as well as being aggravated by heat in sweat. Apply the cream as directed to the arms and take the tablets for the other rash for the back. Also recommend taking something like Zyrtec or Allegra daily as an antihistamine to help with itching and rash. Be sure to drink plenty of liquids and stay well-hydrated on her job. Applying cold compresses to the rash often will help it feel better. Meds ordered this encounter  Medications  . triamcinolone cream (KENALOG) 0.1 %    Sig: One  application to arm rash bid    Dispense:  30 g    Refill:  0    Order Specific Question:   Supervising Provider    AnswerMardella Layman:   HAGLER, BRIAN [[id number]]  . predniSONE (DELTASONE) 50 MG tablet    Sig: 1 tab po daily for 6 days. Take with food. Start 01/12/17.    Dispense:  6 tablet    Refill:  0    Order Specific Question:   Supervising Provider    Answer:   Mardella LaymanHAGLER, BRIAN [id number]]  . triamcinolone acetonide (KENALOG-40) injection 40 mg      Hayden RasmussenMabe, Zakyah Yanes, NP 01/11/17 1359

## 2017-01-11 NOTE — ED Triage Notes (Signed)
[facility] with a complaint of a rash that has been off and on for the last week.

## 2017-01-11 NOTE — Discharge Instructions (Signed)
It appears you have to different types of rashes. One is called hives or urticaria and the other one is called contact dermatitis. It may be something you are exposed to at work as well as being aggravated by heat in sweat. Apply the cream as directed to the arms and take the tablets for the other rash for the back. Also recommend taking something like Zyrtec or Allegra daily as an antihistamine to help with itching and rash. Be sure to drink plenty of liquids and stay well-hydrated on her job. Applying cold compresses to the rash often will help it feel better.

## 2017-06-06 ENCOUNTER — Other Ambulatory Visit: Payer: Self-pay

## 2017-06-06 ENCOUNTER — Emergency Department (HOSPITAL_COMMUNITY)
Admission: EM | Admit: 2017-06-06 | Discharge: 2017-06-06 | Disposition: A | Discharge status: Home / Self Care | Payer: Self-pay | Attending: Emergency Medicine | Admitting: Emergency Medicine

## 2017-06-06 ENCOUNTER — Encounter (HOSPITAL_COMMUNITY): Payer: Self-pay

## 2017-06-06 DIAGNOSIS — M5416 Radiculopathy, lumbar region: Secondary | ICD-10-CM | POA: Insufficient documentation

## 2017-06-06 LAB — URINALYSIS, ROUTINE W REFLEX MICROSCOPIC
Bilirubin Urine: NEGATIVE
Glucose, UA: NEGATIVE mg/dL
Hgb urine dipstick: NEGATIVE
Ketones, ur: NEGATIVE mg/dL
Leukocytes, UA: NEGATIVE
Nitrite: NEGATIVE
Protein, ur: NEGATIVE mg/dL
Specific Gravity, Urine: 1.02 (ref 1.005–1.030)
pH: 5 (ref 5.0–8.0)

## 2017-06-06 MED ORDER — LIDOCAINE 5 % EX PTCH
1.0000 | MEDICATED_PATCH | CUTANEOUS | Status: DC
  Administered 2017-06-06: 1 via TRANSDERMAL
  Filled 2017-06-06: qty 1

## 2017-06-06 MED ORDER — HYDROCODONE-ACETAMINOPHEN 5-325 MG PO TABS
1.0000 | ORAL_TABLET | Freq: Once | ORAL | Status: AC
  Administered 2017-06-06: 1 via ORAL
  Filled 2017-06-06: qty 1

## 2017-06-06 MED ORDER — HYDROCODONE-ACETAMINOPHEN 5-325 MG PO TABS: ORAL_TABLET | ORAL | 0 refills | Status: DC

## 2017-06-06 MED ORDER — PREDNISONE 50 MG PO TABS: ORAL_TABLET | ORAL | 0 refills | Status: DC

## 2017-06-06 MED ORDER — KETOROLAC TROMETHAMINE 60 MG/2ML IM SOLN
15.0000 mg | Freq: Once | INTRAMUSCULAR | Status: AC
  Administered 2017-06-06: 15 mg via INTRAMUSCULAR
  Filled 2017-06-06: qty 2

## 2017-06-06 NOTE — ED Triage Notes (Signed)
Patient complains of lower back pain for 4 days, states pain with movement and ROM, denies any known injury

## 2017-06-06 NOTE — ED Provider Notes (Signed)
[name] Provider Note   CSN: [id number] Arrival date & time: 06/06/17  [id number]     History   Chief Complaint Chief Complaint  Patient presents with  . Back Pain    HPI   Blood pressure 111/66, pulse 66, temperature 98.5 F (36.9 C), temperature source Oral, resp. rate 16, SpO2 100 %.  Allen MoundRicardo Mendez is a 41 y.o. male complaining of low back pain radiating down the left leg onset 3 days ago.  Had similar episode approximately 1 year ago.  There is been no trauma, incontinence, fever, chills, numbness weakness or difficulty ambulating.  He states he is taken ibuprofen at home with little relief.  He states the pain is exacerbated by exertion and weightbearing.  He notes increasing urinary frequency with no dysuria, hematuria or urethral discharge.   History reviewed. No pertinent past medical history.  There are no active problems to display for this patient.   History reviewed. No pertinent surgical history.     Home Medications    Prior to Admission medications   Medication Sig Start Date End Date Taking? Authorizing Provider  HYDROcodone-acetaminophen (NORCO/VICODIN) 5-325 MG tablet Take 1-2 tablets by mouth every 6 hours as needed for pain and/or cough. 06/06/17   Yann Biehn, Joni ReiningNicole, PA-C  predniSONE (DELTASONE) 50 MG tablet Take 1 tablet daily with breakfast 06/06/17   Virat Prather, Joni ReiningNicole, PA-C  triamcinolone cream (KENALOG) 0.1 % One application to arm rash bid 01/11/17   Hayden RasmussenMabe, David, NP    Family History No family history on file.  Social History Social History   Tobacco Use  . Smoking status: Never Smoker  Substance Use Topics  . Alcohol use: No  . Drug use: No     Allergies   Patient has no known allergies.   Review of Systems Review of Systems  A complete review of systems was obtained and all systems are negative except as noted in the HPI and PMH.   Physical Exam Updated Vital Signs BP 111/66 (BP  Location: Right Arm)   Pulse 66   Temp 98.5 F (36.9 C) (Oral)   Resp 16   SpO2 100%   Physical Exam  Constitutional: He appears well-developed and well-nourished.  HENT:  Head: Normocephalic.  Eyes: Conjunctivae are normal.  Neck: Normal range of motion.  Cardiovascular: Normal rate, regular rhythm and intact distal pulses.  Pulmonary/Chest: Effort normal.  Abdominal: Soft. There is no tenderness.  Genitourinary:  Genitourinary Comments: No CVA tenderness to percussion bilaterally  Neurological: He is alert.  No point tenderness to percussion of lumbar spinal processes.  No TTP or paraspinal muscular spasm. Strength is 5 out of 5 to bilateral lower extremities at hip and knee; extensor hallucis longus 5 out of 5. Ankle strength 5 out of 5, no clonus, neurovascularly intact. No saddle anaesthesia. Patellar reflexes are 2+ bilaterally.    Strait leg raise is positive on the ipsilateral (left) approximately 30 degrees, positive on the contralateral side at 45 degrees   Psychiatric: He has a normal mood and affect.  Nursing note and vitals reviewed.    ED Treatments / Results  Labs (all labs ordered are listed, but only abnormal results are displayed) Labs Reviewed  URINALYSIS, ROUTINE W REFLEX MICROSCOPIC    EKG  EKG Interpretation None       Radiology No results found.  Procedures Procedures (including critical care time)  Medications Ordered in ED Medications  lidocaine (LIDODERM) 5 % 1 patch (1 patch Transdermal Patch  Applied 06/06/17 1138)  ketorolac (TORADOL) injection 15 mg (15 mg Intramuscular Given 06/06/17 1137)  HYDROcodone-acetaminophen (NORCO/VICODIN) 5-325 MG per tablet 1 tablet (1 tablet Oral Given 06/06/17 1138)     Initial Impression / Assessment and Plan / ED Course  I have reviewed the triage vital signs and the nursing notes.  Pertinent labs & imaging results that were available during my care of the patient were reviewed by me and considered  in my medical decision making (see chart for details).     Vitals:   06/06/17 0937 06/06/17 1127  BP: 118/72 111/66  Pulse: (!) 54 66  Resp: 16 16  Temp: 98.5 F (36.9 C)   TempSrc: Oral   SpO2: 99% 100%    Medications  lidocaine (LIDODERM) 5 % 1 patch (1 patch Transdermal Patch Applied 06/06/17 1138)  ketorolac (TORADOL) injection 15 mg (15 mg Intramuscular Given 06/06/17 1137)  HYDROcodone-acetaminophen (NORCO/VICODIN) 5-325 MG per tablet 1 tablet (1 tablet Oral Given 06/06/17 1138)    Vashti HeyRicardo Mendez is 41 y.o. male presenting with low back pain rating down left leg consistent with lumbar radiculopathy, nonfocal neurologic exam is no red flags for spinal process.  Patient is reporting a urinary frequency however urinalysis without signs of infection.  No CVA tenderness to percussion.  Evaluation does not show pathology that would require ongoing emergent intervention or inpatient treatment. Pt is hemodynamically stable and mentating appropriately. Discussed findings and plan with patient/guardian, who agrees with care plan. All questions answered. Return precautions discussed and outpatient follow up given.   Final Clinical Impressions(s) / ED Diagnoses   Final diagnoses:  Lumbar radiculopathy    ED Discharge Orders        Ordered    HYDROcodone-acetaminophen (NORCO/VICODIN) 5-325 MG tablet     06/06/17 1306    predniSONE (DELTASONE) 50 MG tablet     06/06/17 1306       Darcy Cordner, Mardella Laymanicole, PA-C 06/06/17 1311    Little, Ambrose Finlandachel Morgan, MD 06/06/17 [phone]

## 2017-06-06 NOTE — Discharge Instructions (Signed)
Take vicodin for breakthrough pain, do not drink alcohol, drive, care for children or do other critical tasks while taking vicodin. ° ° °Do not hesitate to return to the emergency room for any new, worsening or concerning symptoms. ° °Please obtain primary care using resource guide below. Let them know that you were seen in the emergency room and that they will need to obtain records for further outpatient management. ° ° °

## 2017-07-18 ENCOUNTER — Emergency Department (HOSPITAL_COMMUNITY)
Admission: EM | Admit: 2017-07-18 | Discharge: 2017-07-19 | Disposition: A | Discharge status: Home / Self Care | Payer: Self-pay | Attending: Emergency Medicine | Admitting: Emergency Medicine

## 2017-07-18 ENCOUNTER — Emergency Department (HOSPITAL_COMMUNITY): Payer: Self-pay

## 2017-07-18 ENCOUNTER — Other Ambulatory Visit: Payer: Self-pay

## 2017-07-18 ENCOUNTER — Encounter (HOSPITAL_COMMUNITY): Payer: Self-pay

## 2017-07-18 DIAGNOSIS — M79631 Pain in right forearm: Secondary | ICD-10-CM | POA: Insufficient documentation

## 2017-07-18 DIAGNOSIS — M7989 Other specified soft tissue disorders: Secondary | ICD-10-CM

## 2017-07-18 MED ORDER — IBUPROFEN 800 MG PO TABS
800.0000 mg | ORAL_TABLET | Freq: Once | ORAL | Status: AC
  Administered 2017-07-18: 800 mg via ORAL
  Filled 2017-07-18: qty 1

## 2017-07-18 MED ORDER — NAPROXEN 500 MG PO TABS: 500.0000 mg | ORAL_TABLET | Freq: Two times a day (BID) | ORAL | 0 refills | Status: DC

## 2017-07-18 NOTE — Discharge Instructions (Addendum)
Please read and follow all provided instructions.  You have been seen today for pain and swelling to your right hand and forearm.   Tests performed today include: An x-ray of the affected area - does NOT show any broken bones or dislocations.  Vital signs. See below for your results today.  I am unsure of the exact cause of your pain and swelling, but suspect it to be muscle or tendon related at this time.  Home care instructions: -- *PRICE in the first 24-48 hours: Protect (with brace, splint, sling), if given by your provider Rest Ice- Do not apply ice pack directly to your skin, place towel or similar between your skin and ice/ice pack. Apply ice for 20 min, then remove for 40 min while awake Compression- Wear brace, elastic bandage, splint as directed by your provider Elevate affected extremity above the level of your heart when not walking around for the first 24-48 hours   I have given you a prescription for Naproxen: This is a nonsteroidal anti-inflammatory medication that will help with pain and swelling.  May take this once every 12 hours as needed.  Take this with food as it can cause stomach upset and at worst stomach bleeding.  Do not take other NSAIDs (ibuprofen, Motrin, Advil, Aleve) with this medication as it will further irritate your stomach.  You may supplement with Tylenol.  Follow-up instructions: Please follow-up with your primary care provider within the next 5-7 days for re-evalaution, if you do not have one information for our community clinic and an alternative primary care provider are provided in your discharge instructions.   Return instructions:  Please return if your fingers or hand are numb or tingling, appear gray or blue, or you have severe pain (also elevate the hand and loosen splint or wrap if you were given one) Please return to the Emergency Department if you experience worsening symptoms.  Please return if you have any other emergent  concerns. Additional Information:  Your vital signs today were: BP 110/62 (BP Location: Left Arm)    Pulse 60    Temp 98.1 F (36.7 C) (Oral)    Resp 16    SpO2 99%  If your blood pressure (BP) was elevated above 135/85 this visit, please have this repeated by your doctor within one month. ---------------

## 2017-07-18 NOTE — ED Provider Notes (Signed)
[facility] EMERGENCY DEPARTMENT Provider Note   CSN: [id number] Arrival date & time: 07/18/17  2157     History   Chief Complaint Chief Complaint  Patient presents with  . Hand Injury    HPI Allen Mendez is a 42 y.o. male without significant past medical history who presents to the ED with complaint of R hand/wrist/foreamr swelling, pain, and vibration sensation over past 3 days. Patient states that he first noted swelling to the dorsolateral aspect of his R hand extending to the dorsolateral forearm. States that a few hours later he started to have pain in that area specifically with movement of wrists and digits as well as with palpation, otherwise no pain. Describes it as an aching/pulling pain. States he then started to notice a "vibrating sensation" to this location with movement of the wrist and digits. No other alleviating/aggravating factors. No injury or change in activity. Patient is R hand dominant. Denies numbness, weakness, neck pain, fever, or chills.   HPI  History reviewed. No pertinent past medical history.  There are no active problems to display for this patient.   History reviewed. No pertinent surgical history.     Home Medications    Prior to Admission medications   Medication Sig Start Date End Date Taking? Authorizing Provider  HYDROcodone-acetaminophen (NORCO/VICODIN) 5-325 MG tablet Take 1-2 tablets by mouth every 6 hours as needed for pain and/or cough. 06/06/17   Pisciotta, Joni Reining, PA-C  predniSONE (DELTASONE) 50 MG tablet Take 1 tablet daily with breakfast 06/06/17   Pisciotta, Joni Reining, PA-C  triamcinolone cream (KENALOG) 0.1 % One application to arm rash bid 01/11/17   Hayden Rasmussen, NP    Family History History reviewed. No pertinent family history.  Social History Social History   Tobacco Use  . Smoking status: Never Smoker  . Smokeless tobacco: Never Used  Substance Use Topics  . Alcohol use: No  . Drug use: No       Allergies   Patient has no known allergies.   Review of Systems Review of Systems  Constitutional: Negative for chills and fever.  Gastrointestinal: Negative for abdominal pain.  Musculoskeletal: Negative for neck pain.       Positive for pain, swelling, and "vibration" sensation to R hand/wrist/forearm  Neurological: Negative for weakness and numbness.       Negative for paresthesias   Physical Exam Updated Vital Signs BP 110/62 (BP Location: Left Arm)   Pulse 60   Temp 98.1 F (36.7 C) (Oral)   Resp 16   SpO2 99%   Physical Exam  Constitutional: He appears well-developed and well-nourished.  Non-toxic appearance. No distress.  HENT:  Head: Normocephalic and atraumatic.  Eyes: Conjunctivae are normal. Right eye exhibits no discharge. Left eye exhibits no discharge.  Neck: No spinous process tenderness present.  Cardiovascular:  Pulses:      Radial pulses are 2+ on the right side, and 2+ on the left side.  2+ symmetric ulnar pulses.   Musculoskeletal:  Upper extremities: There is no obvious deformity, erythema, warmth, fluctuance, or ecchymosis. There is mild soft tissue swelling over the dorsolateral aspect of the hand in area consistent with the 1st metacarpal which extends to the dorsolateral wrist and distal 1/2 of the forearm. The patient has full ROM of bilateral elbows without pain. Patient's R wrist flexion/extension is very minimally limited secondary to pain, more prominent with extension. Patient is able to move all R hand digits, unable to close hand to make a  full fist secondary to pain. Patient has tenderness to palpation in area consistent with soft tissue swelling.   Upon palpation of the forearm there is no "vibration sensation" if there is no movement at any joints of the RUE. However when patient performs wrist flexion/extension or moves the digits of the hand there is an odd pulling type sensation along the dorsolateral aspect of the forearm. There is no  palpable thrill.   Neurological: He is alert.  Clear speech. Sensation intact to sharp/dull touch to bilateral upper extremities. Strength of wrist/digits difficult to assess secondary to pain. Radial/ulnar/median nerve function intact- patient is able to make OK sign, thumbs up, and cross 2nd/3rd digits. He is able to touch digits 2-5 to his thumb.   Skin: Capillary refill takes less than 2 seconds.  Psychiatric: He has a normal mood and affect. His behavior is normal. Thought content normal.  Nursing note and vitals reviewed.  ED Treatments / Results  Labs (all labs ordered are listed, but only abnormal results are displayed) Labs Reviewed - No data to display  EKG  EKG Interpretation None       Radiology No results found.  Procedures Procedures (including critical care time)  Medications Ordered in ED Medications - No data to display  Initial Impression / Assessment and Plan / ED Course  I have reviewed the triage vital signs and the nursing notes.  Pertinent labs & imaging results that were available during my care of the patient were reviewed by me and considered in my medical decision making (see chart for details).  Patient presents with RUE pain, swelling, and "vibration" sensation. He is nontoxic appearing, vitals are WNL. Patient with swelling and tenderness to palpation along dorsolateral aspect of R hand and forearm. There is no focal tenderness, no snuffbox tenderness. Patient with 2+ symmetric radial and ulnar pulses, cap refill is < 2 seconds in all upper extremity digits. Sensation is intact. Patient is able to move all digits, the wrist and the elbow. NVI distally. Doubt arterial thrombosis or DVT. Patient is afebrile, no recent surgery, or break in the skin, no overlying erythema or warmth- doubt septic joint, gout, or cellulitis. X-ray negative for fracture or dislocation. Unclear definitve etiology of patient's pain, suspect muscle related soreness or a tendonitis  of the extensor muscles/tendons. Will treat with PRICE protocol, place patient in thumb spica splint, and prescribe naproxen with PCP follow up. I discussed results, treatment plan, need for PCP follow-up, and return precautions with the patient. Provided opportunity for questions, patient confirmed understanding and is in agreement with plan.  Findings and plan of care discussed with supervising physician Dr. Dalene SeltzerSchlossman who is in agreement with plan.     Final Clinical Impressions(s) / ED Diagnoses   Final diagnoses:  Pain and swelling of right forearm    ED Discharge Orders        Ordered    naproxen (NAPROSYN) 500 MG tablet  2 times daily     07/18/17 2346       Thursa Emme, LeipsicSamantha R, PA-C 07/19/17 0054    Alvira MondaySchlossman, Erin, MD 07/19/17 1411

## 2017-07-18 NOTE — ED Triage Notes (Signed)
Onset several days pt noticed veins in right hand were swollen, now having pain in wrist area and can feel a vibration when fingers touch over veins.  No known injury.  Unable to make fist.

## 2017-07-19 NOTE — Progress Notes (Signed)
Orthopedic Tech Progress Note Patient Details:  Allen HeyRicardo Mendez [birth year] [id number]  Ortho Devices Type of Ortho Device: Thumb velcro splint Ortho Device/Splint Location: rue Ortho Device/Splint Interventions: Ordered, Application, Adjustment   Post Interventions Patient Tolerated: Well Instructions Provided: Care of device, Adjustment of device   Trinna PostMartinez, Alesandro Stueve J 07/19/2017, 12:12 AM

## 2017-09-19 ENCOUNTER — Emergency Department (HOSPITAL_COMMUNITY)
Admission: EM | Admit: 2017-09-19 | Discharge: 2017-09-20 | Disposition: A | Discharge status: Home / Self Care | Payer: Self-pay | Attending: Emergency Medicine | Admitting: Emergency Medicine

## 2017-09-19 ENCOUNTER — Encounter (HOSPITAL_COMMUNITY): Payer: Self-pay | Admitting: Emergency Medicine

## 2017-09-19 ENCOUNTER — Other Ambulatory Visit: Payer: Self-pay

## 2017-09-19 DIAGNOSIS — Z79899 Other long term (current) drug therapy: Secondary | ICD-10-CM | POA: Insufficient documentation

## 2017-09-19 DIAGNOSIS — Y9389 Activity, other specified: Secondary | ICD-10-CM | POA: Insufficient documentation

## 2017-09-19 DIAGNOSIS — S0502XA Injury of conjunctiva and corneal abrasion without foreign body, left eye, initial encounter: Secondary | ICD-10-CM | POA: Insufficient documentation

## 2017-09-19 DIAGNOSIS — Y929 Unspecified place or not applicable: Secondary | ICD-10-CM | POA: Insufficient documentation

## 2017-09-19 DIAGNOSIS — X58XXXA Exposure to other specified factors, initial encounter: Secondary | ICD-10-CM | POA: Insufficient documentation

## 2017-09-19 DIAGNOSIS — Y999 Unspecified external cause status: Secondary | ICD-10-CM | POA: Insufficient documentation

## 2017-09-19 MED ORDER — TETRACAINE HCL 0.5 % OP SOLN
2.0000 [drp] | Freq: Once | OPHTHALMIC | Status: AC
  Administered 2017-09-20: 2 [drp] via OPHTHALMIC
  Filled 2017-09-19: qty 4

## 2017-09-19 MED ORDER — FLUORESCEIN SODIUM 1 MG OP STRP
1.0000 | ORAL_STRIP | Freq: Once | OPHTHALMIC | Status: AC
  Administered 2017-09-20: 1 via OPHTHALMIC
  Filled 2017-09-19: qty 1

## 2017-09-19 NOTE — ED Triage Notes (Signed)
The patient was cutting a piece of wood and he thinks a piece went in his eye.  He had put some drops but it did not help.  He feels like something is in there and he can feel it when he blinks.  Denies blurred vision and he does not wear glasses.  Rates 7/10.

## 2017-09-20 MED ORDER — ERYTHROMYCIN 5 MG/GM OP OINT
1.0000 "application " | TOPICAL_OINTMENT | Freq: Once | OPHTHALMIC | Status: AC
  Administered 2017-09-20: 1 via OPHTHALMIC
  Filled 2017-09-20: qty 3.5

## 2017-09-20 NOTE — ED Provider Notes (Signed)
[facility] EMERGENCY DEPARTMENT Provider Note   CSN: [id number] Arrival date & time: 09/19/17  2206     History   Chief Complaint Chief Complaint  Patient presents with  . Eye Injury    HPI Allen Mendez is a 42 y.o. male.  Patient presents the emergency department with foreign body sensation in his left eye starting acutely at approximately 4 PM while he was sawing wood with a hand saw.  He felt something go into his eye.  He tried to clear out with drops.  No other treatments.  Patient does not wear glasses or contacts.  No vision change just irritation.  No redness or swelling.  No drainage.      History reviewed. No pertinent past medical history.  There are no active problems to display for this patient.   History reviewed. No pertinent surgical history.     Home Medications    Prior to Admission medications   Medication Sig Start Date End Date Taking? Authorizing Provider  HYDROcodone-acetaminophen (NORCO/VICODIN) 5-325 MG tablet Take 1-2 tablets by mouth every 6 hours as needed for pain and/or cough. 06/06/17   Pisciotta, Joni Reining, PA-C  naproxen (NAPROSYN) 500 MG tablet Take 1 tablet (500 mg total) by mouth 2 (two) times daily. 07/18/17   Petrucelli, Pleas Koch, PA-C  predniSONE (DELTASONE) 50 MG tablet Take 1 tablet daily with breakfast 06/06/17   Pisciotta, Joni Reining, PA-C  triamcinolone cream (KENALOG) 0.1 % One application to arm rash bid 01/11/17   Hayden Rasmussen, NP    Family History History reviewed. No pertinent family history.  Social History Social History   Tobacco Use  . Smoking status: Never Smoker  . Smokeless tobacco: Never Used  Substance Use Topics  . Alcohol use: No  . Drug use: No     Allergies   Patient has no known allergies.   Review of Systems Review of Systems  Constitutional: Negative for fever.  HENT: Negative for rhinorrhea and sore throat.   Eyes: Positive for pain. Negative for photophobia and  redness.  Gastrointestinal: Negative for nausea and vomiting.  Skin: Negative for color change.  Neurological: Negative for headaches.     Physical Exam Updated Vital Signs BP 131/86 (BP Location: Right Arm)   Pulse 60   Temp 97.6 F (36.4 C) (Oral)   Resp 16   SpO2 99%   Physical Exam  Constitutional: He appears well-developed and well-nourished.  HENT:  Head: Normocephalic and atraumatic.  Eyes: Pupils are equal, round, and reactive to light. Lids are everted and swept, no foreign bodies found. Right eye exhibits no chemosis and no discharge. Left eye exhibits no chemosis and no discharge. Right conjunctiva is not injected. Right conjunctiva has no hemorrhage. Left conjunctiva is injected (mild). Left conjunctiva has no hemorrhage.  Slit lamp exam:      The left eye shows fluorescein uptake. The left eye shows no corneal abrasion.    Neck: Normal range of motion. Neck supple.  Pulmonary/Chest: No respiratory distress.  Neurological: He is alert.  Skin: Skin is warm and dry.  Psychiatric: He has a normal mood and affect.  Nursing note and vitals reviewed.    ED Treatments / Results  Labs (all labs ordered are listed, but only abnormal results are displayed) Labs Reviewed - No data to display  EKG  EKG Interpretation None       Radiology No results found.  Procedures Procedures (including critical care time)  Medications Ordered in ED Medications  tetracaine (PONTOCAINE) 0.5 % ophthalmic solution 2 drop (2 drops Left Eye Given 09/20/17 0016)  fluorescein ophthalmic strip 1 strip (1 strip Left Eye Given 09/20/17 0016)  erythromycin ophthalmic ointment 1 application (1 application Left Eye Given 09/20/17 0044)     Initial Impression / Assessment and Plan / ED Course  I have reviewed the triage vital signs and the nursing notes.  Pertinent labs & imaging results that were available during my care of the patient were reviewed by me and considered in my medical  decision making (see chart for details).     Patient seen and examined. Work-up initiated. Medications ordered.   Vital signs reviewed and are as follows: BP 131/86 (BP Location: Right Arm)   Pulse 60   Temp 97.6 F (36.4 C) (Oral)   Resp 16   SpO2 99%   Two drops of tetracaine instilled into affected eye.   Fluorescein strip applied to affected eye. Wood's lamp used to assess for corneal abrasion.    Lid everted, no FB noted or seen. Mild conjunctival inflammation.  Patient tolerated procedure well without immediate complication.   Plan: Irriagate eye well, home with erythromycin ointment. Eye follow-up in 48 hrs if not improved.   20/20 right/left/bilateral vision per RN.    Final Clinical Impressions(s) / ED Diagnoses   Final diagnoses:  Abrasion of left cornea, initial encounter   Patient with eye irritation, possible small corneal abrasion on exam after wooden FB. No foreign bodies noted at time of exam. Irrigation performed and patient started on erythro ointment. No surrounding erythema, swelling, vision changes/loss suspicious for orbital or periorbital cellulitis. No signs of iritis. No signs of glaucoma. No symptoms of retinal detachment. No ophthalmologic emergency suspected. Outpatient referral given in case of no improvement.    ED Discharge Orders    None       Renne CriglerGeiple, Anjolaoluwa Siguenza, Cordelia Poche-C 09/20/17 [id number]    Devoria AlbeKnapp, Iva, MD 09/20/17 [phone]

## 2017-09-20 NOTE — Discharge Instructions (Signed)
Please read and follow all provided instructions.  Your diagnoses today include:  1. Abrasion of left cornea, initial encounter     Tests performed today include:  Visual acuity testing to check your vision  Fluorescein dye examination to look for scratches on your eye  Vital signs. See below for your results today.   Medications prescribed:   Erythromycin  - antibiotic eye ointment  Use this medication as follows:  Apply 1/4" of the antibiotic ointment to affected eye up to 6 times a day while awake for 7 days  Take any prescribed medications only as directed.  Home care instructions:  Follow any educational materials contained in this packet.  You have a scratch of the eye on the cornea (the clear part of the eye). This condition may be caused by trauma. It is a common problem for people who wear contact lenses. Proper treatment is important. No evidence of infection is noted today, but you could develop an infection called a corneal ulcer or have some retained foreign body that may or may not have been noted today in the Emergency Department. Ulcers are not only painful, but they may also scar the cornea and cause permanent damage to the eye.   If you wear contact lenses, do not use them until your eye caregiver approves. Follow-up care is necessary to be sure the corneal abrasion is healing if not completely resolved in 2-3 days. See your caregiver or eye specialist as suggested for followup.   Follow-up instructions: Please follow-up with the opthalmologist listed in the next 2 days for further evaluation of your symptoms if the eye is not feeling better.   Return instructions:   Please return to the Emergency Department if you experience worsening symptoms.   Please return immediately if you develop severe pain, pus drainage, new change in vision, or fever.  Please return if you have any other emergent concerns.  Additional Information:  Your vital signs today  were: BP 131/86 (BP Location: Right Arm)    Pulse 60    Temp 97.6 F (36.4 C) (Oral)    Resp 16    SpO2 99%  If your blood pressure (BP) was elevated above 135/85 this visit, please have this repeated by your doctor within one month.

## 2017-09-20 NOTE — ED Notes (Signed)
20/20 both eyes. Perfect

## 2018-06-17 ENCOUNTER — Emergency Department (HOSPITAL_COMMUNITY)
Admission: EM | Admit: 2018-06-17 | Discharge: 2018-06-17 | Disposition: A | Discharge status: Home / Self Care | Payer: Self-pay | Attending: Emergency Medicine | Admitting: Emergency Medicine

## 2018-06-17 ENCOUNTER — Other Ambulatory Visit: Payer: Self-pay

## 2018-06-17 ENCOUNTER — Encounter (HOSPITAL_COMMUNITY): Payer: Self-pay

## 2018-06-17 DIAGNOSIS — S0501XA Injury of conjunctiva and corneal abrasion without foreign body, right eye, initial encounter: Secondary | ICD-10-CM | POA: Insufficient documentation

## 2018-06-17 DIAGNOSIS — Y999 Unspecified external cause status: Secondary | ICD-10-CM | POA: Insufficient documentation

## 2018-06-17 DIAGNOSIS — Y9389 Activity, other specified: Secondary | ICD-10-CM | POA: Insufficient documentation

## 2018-06-17 DIAGNOSIS — W208XXA Other cause of strike by thrown, projected or falling object, initial encounter: Secondary | ICD-10-CM | POA: Insufficient documentation

## 2018-06-17 DIAGNOSIS — Y929 Unspecified place or not applicable: Secondary | ICD-10-CM | POA: Insufficient documentation

## 2018-06-17 MED ORDER — TETRACAINE HCL 0.5 % OP SOLN
1.0000 [drp] | Freq: Once | OPHTHALMIC | Status: AC
  Administered 2018-06-17: 1 [drp] via OPHTHALMIC
  Filled 2018-06-17: qty 4

## 2018-06-17 MED ORDER — SULFACETAMIDE SODIUM 10 % OP SOLN: 1.0000 [drp] | OPHTHALMIC | 0 refills | Status: DC

## 2018-06-17 MED ORDER — FLUORESCEIN SODIUM 1 MG OP STRP
1.0000 | ORAL_STRIP | Freq: Once | OPHTHALMIC | Status: AC
  Administered 2018-06-17: 1 via OPHTHALMIC
  Filled 2018-06-17: qty 1

## 2018-06-17 NOTE — ED Notes (Signed)
Eye irrigated with saline

## 2018-06-17 NOTE — Discharge Instructions (Signed)
Return to ED for worsening symptoms, increased swelling of your eye, pain with eye movements or trouble moving your eye, fever or additional injury.

## 2018-06-17 NOTE — ED Provider Notes (Signed)
[facility] Provider Note   CSN: [id number] Arrival date & time: 06/17/18  2042     History   Chief Complaint Chief Complaint  Patient presents with  . Eye Pain    HPI Allen Mendez is a 42 y.o. male who presents to ED for 1 day history of right eye irritation, tearing and foreign body sensation.  States that he was cutting some wood and some of the shards got into his hoodie.  When he tried to dust off his hoodie, he believes a piece may have got into his right eye.  He has tried over-the-counter eyedrops with no improvement in his symptoms.  He did not irrigate the eye out after the incident.  He denies any contact lens use, vision changes, fever, swelling around eye, pain with EOMs.  HPI  History reviewed. No pertinent past medical history.  There are no active problems to display for this patient.   History reviewed. No pertinent surgical history.      Home Medications    Prior to Admission medications   Medication Sig Start Date End Date Taking? Authorizing Provider  HYDROcodone-acetaminophen (NORCO/VICODIN) 5-325 MG tablet Take 1-2 tablets by mouth every 6 hours as needed for pain and/or cough. 06/06/17   Pisciotta, Joni ReiningNicole, PA-C  naproxen (NAPROSYN) 500 MG tablet Take 1 tablet (500 mg total) by mouth 2 (two) times daily. 07/18/17   Petrucelli, Pleas KochSamantha R, PA-C  predniSONE (DELTASONE) 50 MG tablet Take 1 tablet daily with breakfast 06/06/17   Pisciotta, Joni ReiningNicole, PA-C  sulfacetamide (BLEPH-10) 10 % ophthalmic solution Place 1 drop into the right eye every 4 (four) hours. 06/17/18   Dietrich PatesKhatri, [place], PA-C  triamcinolone cream (KENALOG) 0.1 % One application to arm rash bid 01/11/17   Hayden RasmussenMabe, David, NP    Family History No family history on file.  Social History Social History   Tobacco Use  . Smoking status: Never Smoker  . Smokeless tobacco: Never Used  Substance Use Topics  . Alcohol use: No  . Drug use: No      Allergies   Patient has no known allergies.   Review of Systems Review of Systems  Constitutional: Negative for chills and fever.  Eyes: Positive for discharge, redness and itching.  Neurological: Negative for headaches.     Physical Exam Updated Vital Signs BP 127/82 (BP Location: Right Arm)   Pulse (!) 56   Temp (!) 97.5 F (36.4 C) (Oral)   Resp 16   Ht 5\' 4"  (1.626 m)   Wt 68.9 kg   SpO2 99%   BMI 26.09 kg/m   Physical Exam Vitals signs and nursing note reviewed.  Constitutional:      General: He is not in acute distress.    Appearance: He is well-developed. He is not diaphoretic.  HENT:     Head: Normocephalic and atraumatic.  Eyes:     General: No scleral icterus.       Right eye: Discharge present.     Conjunctiva/sclera: Conjunctivae normal.     Pupils: Pupils are equal, round, and reactive to light.     Comments: Right eye with injected conjunctiva, no eyelid swelling or erythema or tenderness to palpation.  Mild clear tearful drainage noted.  No foreign bodies noted.  No pain with EOMs.  No chemosis, proptosis, or consensual photophobia. Fluorescein stain with possible small corneal abrasion at the 3 o'clock position; no foreign bodies, dendritic lesions, ulcerations, negative Sidel sign.  Neck:  Musculoskeletal: Normal range of motion.  Pulmonary:     Effort: Pulmonary effort is normal. No respiratory distress.  Skin:    Findings: No rash.  Neurological:     Mental Status: He is alert.      ED Treatments / Results  Labs (all labs ordered are listed, but only abnormal results are displayed) Labs Reviewed - No data to display  EKG None  Radiology No results found.  Procedures Procedures (including critical care time)  Medications Ordered in ED Medications  tetracaine (PONTOCAINE) 0.5 % ophthalmic solution 1 drop (has no administration in time range)  fluorescein ophthalmic strip 1 strip (has no administration in time range)      Initial Impression / Assessment and Plan / ED Course  I have reviewed the triage vital signs and the nursing notes.  Pertinent labs & imaging results that were available during my care of the patient were reviewed by me and considered in my medical decision making (see chart for details).     42 year old male presents to ED for 1 day history of right eye irritation.  Unknown if he got a piece of wood in his eye after cutting wood yesterday.  Denies contact lens use.  No visual changes noted.  Visual acuity exam within normal limits.  Right eye shows possible small corneal abrasion.  Clear drainage noted.  EOMs are intact.  No swelling noted.  No signs of orbital cellulitis, iritis, keratitis or other emergent cause of symptoms.  Will cover with sulfacetamide for possible abrasion and give ophthalmology follow-up.  Advised to return to ED for any severe worsening symptoms.  Patient is hemodynamically stable, in NAD, and able to ambulate in the ED. Evaluation does not show pathology that would require ongoing emergent intervention or inpatient treatment. I explained the diagnosis to the patient. Pain has been managed and has no complaints prior to discharge. Patient is comfortable with above plan and is stable for discharge at this time. All questions were answered prior to disposition. Strict return precautions for returning to the ED were discussed. Encouraged follow up with PCP.    Portions of this note were generated with Scientist, clinical (histocompatibility and immunogenetics). Dictation errors may occur despite best attempts at proofreading.   Final Clinical Impressions(s) / ED Diagnoses   Final diagnoses:  Abrasion of right cornea, initial encounter    ED Discharge Orders         Ordered    sulfacetamide (BLEPH-10) 10 % ophthalmic solution  Every 4 hours     06/17/18 2107           Dietrich Pates, PA-C 06/17/18 2108    Virgina Norfolk, DO 06/18/18 [phone]

## 2018-06-17 NOTE — ED Triage Notes (Signed)
Pt reports right eye pain since yesterday, no injury reported. Pt does not wear contacts. Eye very red, no drainage

## 2019-08-21 IMAGING — CR DG FOREARM 2V*R*
2 series · 2 of 2 positions shown · non-contrast
Comparison: None.

CLINICAL DATA: Pain and swelling for 3 days.  No injury.

EXAM:
RIGHT HAND - COMPLETE 3+ VIEW; RIGHT FOREARM - 2 VIEW

[forearm ap]
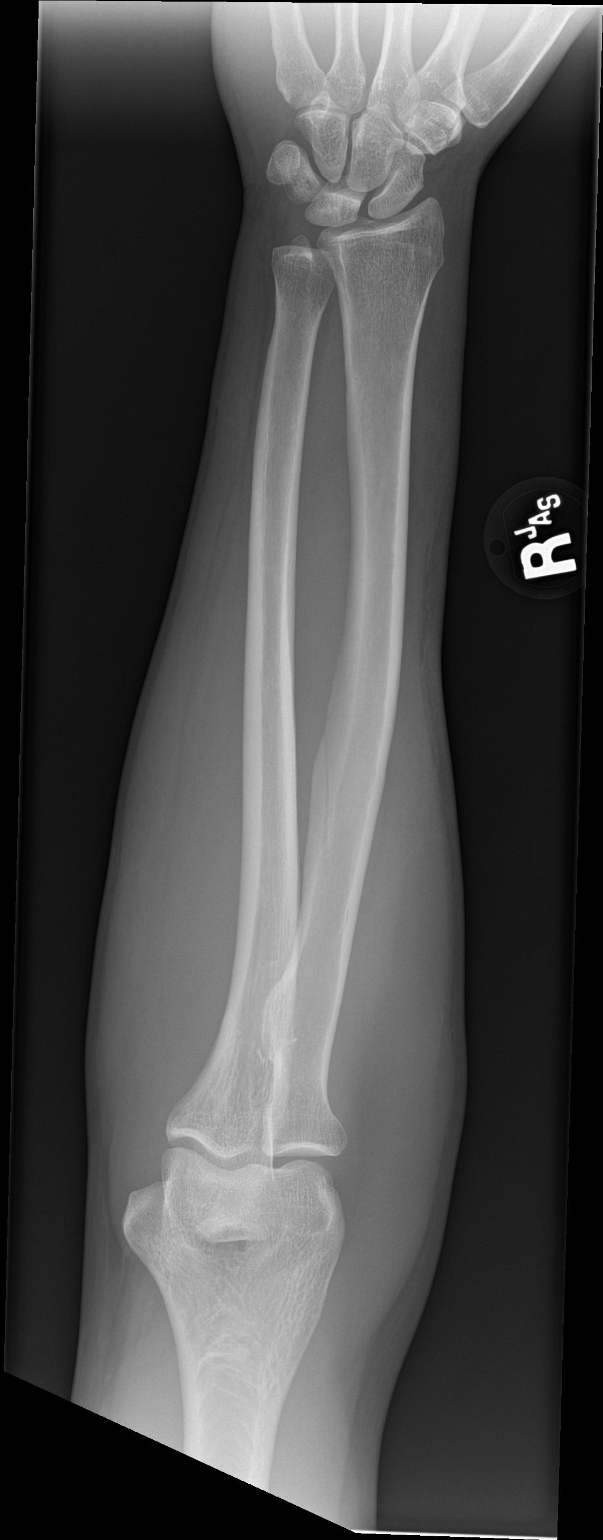

[forearm lat]
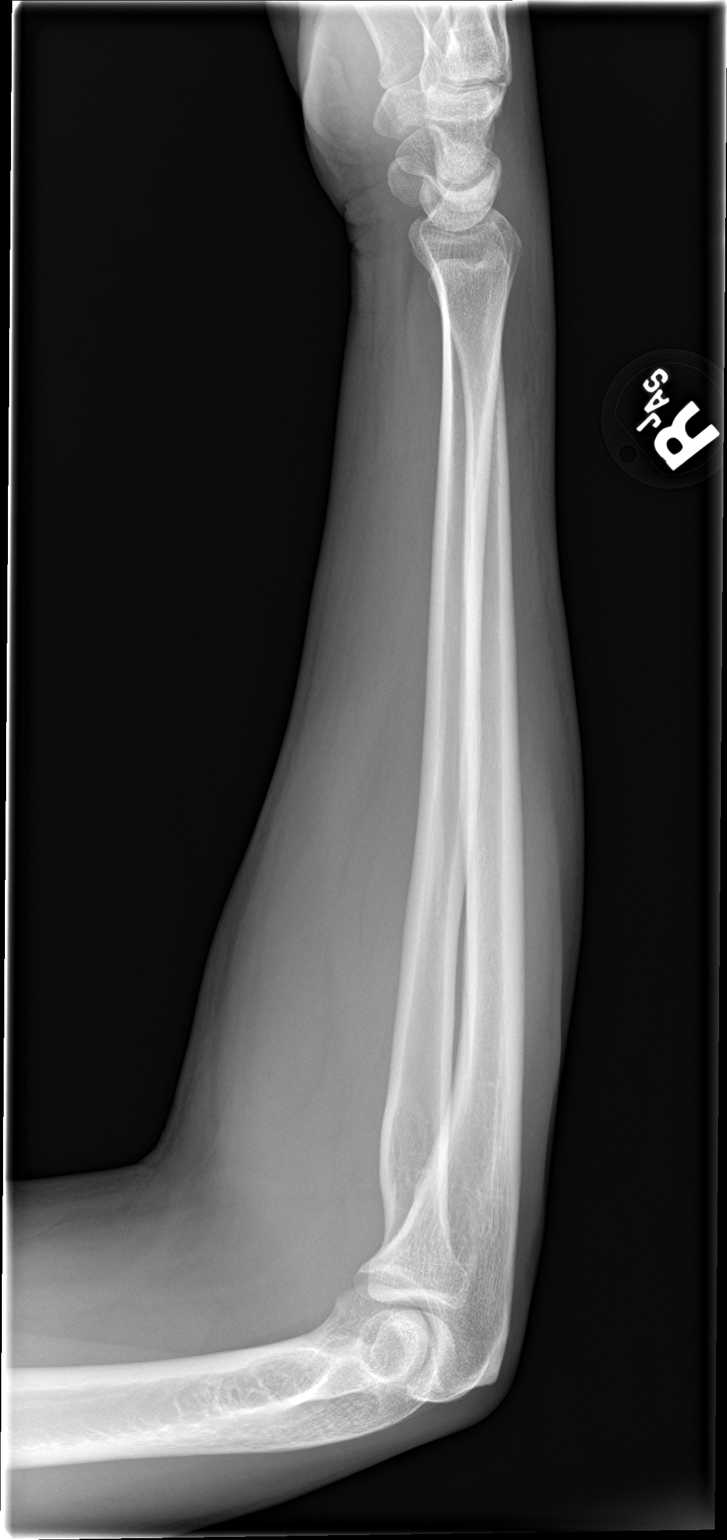

[2 of 2 positions shown; findings below may reference images not displayed]

FINDINGS: RIGHT hand: There is no evidence of fracture or dislocation. There
is no evidence of arthropathy or other focal bone abnormality. Soft
tissues are unremarkable.

RIGHT forearm: No acute fracture deformity or dislocation. No
destructive bony lesions. Soft tissue planes are not suspicious.
IMPRESSION: Negative.

## 2020-04-01 ENCOUNTER — Encounter (HOSPITAL_COMMUNITY): Payer: Self-pay | Admitting: Urgent Care

## 2020-04-01 ENCOUNTER — Ambulatory Visit (HOSPITAL_COMMUNITY)
Admission: EM | Admit: 2020-04-01 | Discharge: 2020-04-01 | Disposition: A | Discharge status: Home / Self Care | Payer: Self-pay | Attending: Urgent Care | Admitting: Urgent Care

## 2020-04-01 ENCOUNTER — Other Ambulatory Visit: Payer: Self-pay

## 2020-04-01 DIAGNOSIS — L03115 Cellulitis of right lower limb: Secondary | ICD-10-CM

## 2020-04-01 DIAGNOSIS — M79604 Pain in right leg: Secondary | ICD-10-CM

## 2020-04-01 MED ORDER — DOXYCYCLINE HYCLATE 100 MG PO CAPS: 100.0000 mg | ORAL_CAPSULE | Freq: Two times a day (BID) | ORAL | 0 refills | Status: DC

## 2020-04-01 MED ORDER — NAPROXEN 500 MG PO TABS: 500.0000 mg | ORAL_TABLET | Freq: Two times a day (BID) | ORAL | 0 refills | Status: DC

## 2020-04-01 NOTE — ED Triage Notes (Signed)
Provider assessed Pt with RT leg pain with Hx of mass and Pain.

## 2020-04-01 NOTE — ED Provider Notes (Signed)
  [facility]   MRN: [id number] DOB: Nov 26, 1975  Subjective:   Allen Mendez is a 44 y.o. male presenting for 1 week history of acute onset right lower leg swelling, warmth, pain.  Has had some intermittent burning sensation just inferior to the painful swelling.  Has not used medications for relief.  Denies drainage, falls, trauma.  Denies history of abscesses.  Denies bleeding.  He is not currently taking any medications and has no known food or drug allergies.  Denies past medical and surgical history.  History reviewed. No pertinent family history.  Social History   Tobacco Use  . Smoking status: Never Smoker  . Smokeless tobacco: Never Used  Substance Use Topics  . Alcohol use: No  . Drug use: No    ROS   Objective:   Vitals: BP 110/69 Comment (BP Location): Vitals taken by Urban Gibson  Pulse (!) 54   Temp 98.5 F (36.9 C) (Oral)   Resp 14   SpO2 100%   Physical Exam Constitutional:      General: He is not in acute distress.    Appearance: Normal appearance. He is well-developed and normal weight. He is not ill-appearing, toxic-appearing or diaphoretic.  HENT:     Head: Normocephalic and atraumatic.     Right Ear: External ear normal.     Left Ear: External ear normal.     Nose: Nose normal.     Mouth/Throat:     Pharynx: Oropharynx is clear.  Eyes:     General: No scleral icterus.       Right eye: No discharge.        Left eye: No discharge.     Extraocular Movements: Extraocular movements intact.     Pupils: Pupils are equal, round, and reactive to light.  Cardiovascular:     Rate and Rhythm: Normal rate.  Pulmonary:     Effort: Pulmonary effort is normal.  Musculoskeletal:     Cervical back: Normal range of motion.       Legs:  Skin:    General: Skin is warm and dry.  Neurological:     Mental Status: He is alert and oriented to person, place, and time.  Psychiatric:        Mood and Affect: Mood normal.        Behavior:  Behavior normal.        Thought Content: Thought content normal.        Judgment: Judgment normal.       Assessment and Plan :   PDMP not reviewed this encounter.  1. Cellulitis of right lower extremity   2. Pain of right lower extremity     Start doxycycline, naproxen, warm compresses for cellulitis, possible developing abscess.  Area is not amenable to incision and drainage today. Counseled patient on potential for adverse effects with medications prescribed/recommended today, ER and return-to-clinic precautions discussed, patient verbalized understanding.    Wallis Bamberg, PA-C 04/01/20 1142

## 2020-04-01 NOTE — Discharge Instructions (Addendum)
Use una toalla calientita y aplique pression for 5-10 minutos 3-5 veces al dia para ayudar contra la infeccion. Use doxycycline 2 veces al dia, es el antibiotico. Naproxena para dolor y inflammacion 2 veces al dia. Tome el medicamento con comida.

## 2020-04-08 ENCOUNTER — Ambulatory Visit (HOSPITAL_COMMUNITY): Payer: Self-pay

## 2020-04-08 ENCOUNTER — Ambulatory Visit (HOSPITAL_COMMUNITY)
Admission: EM | Admit: 2020-04-08 | Discharge: 2020-04-08 | Disposition: A | Discharge status: Home / Self Care | Payer: Self-pay | Attending: Family Medicine | Admitting: Family Medicine

## 2020-04-08 ENCOUNTER — Encounter (HOSPITAL_COMMUNITY): Payer: Self-pay | Admitting: Emergency Medicine

## 2020-04-08 ENCOUNTER — Other Ambulatory Visit: Payer: Self-pay

## 2020-04-08 ENCOUNTER — Ambulatory Visit (INDEPENDENT_AMBULATORY_CARE_PROVIDER_SITE_OTHER): Payer: Self-pay

## 2020-04-08 DIAGNOSIS — M79661 Pain in right lower leg: Secondary | ICD-10-CM

## 2020-04-08 DIAGNOSIS — M7989 Other specified soft tissue disorders: Secondary | ICD-10-CM

## 2020-04-08 DIAGNOSIS — M6740 Ganglion, unspecified site: Secondary | ICD-10-CM

## 2020-04-08 NOTE — ED Provider Notes (Signed)
[facility]    CSN: [id number] Arrival date & time: 04/08/20  1029      History   Chief Complaint Chief Complaint  Patient presents with  . Leg Pain    HPI Allen Mendez is a 44 y.o. male.   Patient is a 44 year old male who presents today with right lower extremity swelling, pain to anterior aspect midway down the leg.  Was seen here on 04/01/2020 and treated for cellulitis of the right lower extremity.  Reporting the redness, pain and swelling has somewhat improved and was resolving until yesterday the swelling started to return with pain.  Denies any associated fever, chills, body aches.  Denies any injuries to the leg.  Has injured this leg previously in the past but never broken his leg before.     History reviewed. No pertinent past medical history.  There are no problems to display for this patient.   History reviewed. No pertinent surgical history.     Home Medications    Prior to Admission medications   Medication Sig Start Date End Date Taking? Authorizing Provider  doxycycline (VIBRAMYCIN) 100 MG capsule Take 1 capsule (100 mg total) by mouth 2 (two) times daily. 04/01/20   Wallis Bamberg, PA-C  naproxen (NAPROSYN) 500 MG tablet Take 1 tablet (500 mg total) by mouth 2 (two) times daily with a meal. 04/01/20   Wallis Bamberg, PA-C    Family History History reviewed. No pertinent family history.  Social History Social History   Tobacco Use  . Smoking status: Never Smoker  . Smokeless tobacco: Never Used  Substance Use Topics  . Alcohol use: No  . Drug use: No     Allergies   Patient has no known allergies.   Review of Systems Review of Systems   Physical Exam Triage Vital Signs ED Triage Vitals  Enc Vitals Group     BP 04/08/20 1205 122/90     Pulse Rate 04/08/20 1205 (!) 56     Resp 04/08/20 1205 16     Temp 04/08/20 1205 97.9 F (36.6 C)     Temp Source 04/08/20 1205 Oral     SpO2 04/08/20 1205 100 %     Weight --       Height --      Head Circumference --      Peak Flow --      Pain Score 04/08/20 1203 6     Pain Loc --      Pain Edu? --      Excl. in GC? --    No data found.  Updated Vital Signs BP 122/90 (BP Location: Right Arm)   Pulse (!) 56   Temp 97.9 F (36.6 C) (Oral)   Resp 16   SpO2 100%   Visual Acuity Right Eye Distance:   Left Eye Distance:   Bilateral Distance:    Right Eye Near:   Left Eye Near:    Bilateral Near:     Physical Exam       UC Treatments / Results  Labs (all labs ordered are listed, but only abnormal results are displayed) Labs Reviewed - No data to display  EKG   Radiology DG Tibia/Fibula Right  Result Date: 04/08/2020 CLINICAL DATA:  Right lower extremity pain and swelling. EXAM: RIGHT TIBIA AND FIBULA - 2 VIEW COMPARISON:  None. FINDINGS: There is no evidence of fracture or other focal bone lesions. Soft tissues are unremarkable. IMPRESSION: Negative. Electronically Signed   By: Fayrene Fearing  Christen Butter M.D.   On: 04/08/2020 12:59    Procedures Procedures (including critical care time)  Medications Ordered in UC Medications - No data to display  Initial Impression / Assessment and Plan / UC Course  I have reviewed the triage vital signs and the nursing notes.  Pertinent labs & imaging results that were available during my care of the patient were reviewed by me and considered in my medical decision making (see chart for details).     Ganglion cyst Infection has improved X ray normal Recommended complete course of medicine that was prescribed.  Ace wrap applied for compression.  Orthopedics as needed.   Final Clinical Impressions(s) / UC Diagnoses   Final diagnoses:  Ganglion cyst     Discharge Instructions     I believe this is a ganglion cyst. We will apply Ace wrap to apply pressure Finish the doxycycline and take the naproxen as needed. If symptoms continue or worsen you need to follow-up with orthopedics.    ED  Prescriptions    None     PDMP not reviewed this encounter.   Dahlia Byes A, NP 04/09/20 [phone]

## 2020-04-08 NOTE — Discharge Instructions (Addendum)
I believe this is a ganglion cyst. We will apply Ace wrap to apply pressure Finish the doxycycline and take the naproxen as needed. If symptoms continue or worsen you need to follow-up with orthopedics.

## 2020-04-08 NOTE — ED Triage Notes (Signed)
pt presents for follow-up after 04/01/20 appt. Pt states pain and redness has not gotten any better. Currently still taking antibiotic prescribed on 04/01/20

## 2021-10-29 DIAGNOSIS — M79604 Pain in right leg: Secondary | ICD-10-CM | POA: Insufficient documentation

## 2022-02-20 ENCOUNTER — Emergency Department (HOSPITAL_COMMUNITY)
Admission: EM | Admit: 2022-02-20 | Discharge: 2022-02-21 | Disposition: A | Discharge status: Home / Self Care | Payer: Self-pay | Attending: Emergency Medicine | Admitting: Emergency Medicine

## 2022-02-20 ENCOUNTER — Emergency Department (HOSPITAL_COMMUNITY): Payer: Self-pay

## 2022-02-20 DIAGNOSIS — W11XXXA Fall on and from ladder, initial encounter: Secondary | ICD-10-CM | POA: Insufficient documentation

## 2022-02-20 DIAGNOSIS — S32010A Wedge compression fracture of first lumbar vertebra, initial encounter for closed fracture: Secondary | ICD-10-CM | POA: Insufficient documentation

## 2022-02-20 DIAGNOSIS — R109 Unspecified abdominal pain: Secondary | ICD-10-CM | POA: Insufficient documentation

## 2022-02-20 DIAGNOSIS — M25522 Pain in left elbow: Secondary | ICD-10-CM | POA: Insufficient documentation

## 2022-02-20 NOTE — ED Triage Notes (Signed)
Pt states he was "trying to change a lightbulb," states he "moved weird & stepladder fell to the side." Pt states he then fell down onto back & L elbow. C/o pain to L lower back, L elbow. CNS intact distal to injury, pt ambulatory on arrival.

## 2022-02-21 ENCOUNTER — Emergency Department (HOSPITAL_COMMUNITY): Payer: Self-pay

## 2022-02-21 LAB — I-STAT CHEM 8, ED
BUN: 21 mg/dL — ABNORMAL HIGH (ref 6–20)
Calcium, Ion: 1.19 mmol/L (ref 1.15–1.40)
Chloride: 103 mmol/L (ref 98–111)
Creatinine, Ser: 0.8 mg/dL (ref 0.61–1.24)
Glucose, Bld: 95 mg/dL (ref 70–99)
HCT: 41 % (ref 39.0–52.0)
Hemoglobin: 13.9 g/dL (ref 13.0–17.0)
Potassium: 3.6 mmol/L (ref 3.5–5.1)
Sodium: 140 mmol/L (ref 135–145)
TCO2: 25 mmol/L (ref 22–32)

## 2022-02-21 MED ORDER — IOHEXOL 300 MG/ML  SOLN
100.0000 mL | Freq: Once | INTRAMUSCULAR | Status: AC | PRN
  Administered 2022-02-21: 100 mL via INTRAVENOUS

## 2022-02-21 MED ORDER — HYDROCODONE-ACETAMINOPHEN 5-325 MG PO TABS: 1.0000 | ORAL_TABLET | Freq: Four times a day (QID) | ORAL | 0 refills | Status: DC | PRN

## 2022-02-21 MED ORDER — FENTANYL CITRATE PF 50 MCG/ML IJ SOSY
100.0000 ug | PREFILLED_SYRINGE | Freq: Once | INTRAMUSCULAR | Status: AC
  Administered 2022-02-21: 100 ug via INTRAVENOUS
  Filled 2022-02-21: qty 2

## 2022-02-21 NOTE — ED Provider Notes (Signed)
[facility] EMERGENCY DEPARTMENT Provider Note   CSN: [id number] Arrival date & time: 02/20/22  2200     History  Chief Complaint  Patient presents with   Allen Mendez is a 46 y.o. male.  The history is provided by the patient.  Fall This is a new problem.  Patient reports he fell from a ladder approximately 6 PM on August 17 He was changing a light bulb when he lost his balance and fell to the ground.  He landed on his back and left elbow.  No head injury or LOC.  He is now having pain left elbow, low back and now having pain in his abdomen. No leg weakness.  No chest pain. He is not on anticoagulation    Home Medications Prior to Admission medications   Medication Sig Start Date End Date Taking? Authorizing Provider  HYDROcodone-acetaminophen (NORCO/VICODIN) 5-325 MG tablet Take 1 tablet by mouth every 6 (six) hours as needed for severe pain. 02/21/22  Yes Zadie Rhine, MD      Allergies    Patient has no known allergies.    Review of Systems   Review of Systems  Musculoskeletal:  Positive for arthralgias and back pain.    Physical Exam Updated Vital Signs BP 117/78 (BP Location: Right Arm)   Pulse (!) 54   Temp 97.7 F (36.5 C) (Oral)   Resp 16   SpO2 100%  Physical Exam CONSTITUTIONAL: Well developed/well nourished HEAD: Normocephalic/atraumatic, no visible trauma EYES: EOMI/PERRL ENMT: Mucous membranes moist, no visible trauma NECK: supple no meningeal signs SPINE/BACK: Diffuse cervical, thoracic and lumbar tenderness Patient maintained in spinal precautions/logroll utilized No bruising/crepitance/stepoffs noted to spine CV: S1/S2 noted, no murmurs/rubs/gallops noted LUNGS: Lungs are clear to auscultation bilaterally, no apparent distress Chest-no bruising or tenderness ABDOMEN: soft, diffuse tenderness, no bruising GU:no cva tenderness NEURO: Pt is awake/alert/appropriate, moves all extremitiesx4.  No facial  droop.  GCS 15 EXTREMITIES: pulses normal/equal, full ROM, mild tenderness left elbow, all other extremities/joints palpated/ranged and nontender SKIN: warm, color normal PSYCH: no abnormalities of mood noted, alert and oriented to situation  ED Results / Procedures / Treatments   Labs (all labs ordered are listed, but only abnormal results are displayed) Labs Reviewed  I-STAT CHEM 8, ED - Abnormal; Notable for the following components:      Result Value   BUN 21 (*)    All other components within normal limits    EKG None  Radiology CT Thoracic Spine Wo Contrast  Result Date: 02/21/2022 CLINICAL DATA:  Fall EXAM: CT THORACIC AND LUMBAR SPINE WITHOUT CONTRAST TECHNIQUE: Multidetector CT imaging of the thoracic and lumbar spine was performed without contrast. Multiplanar CT image reconstructions were also generated. RADIATION DOSE REDUCTION: This exam was performed according to the departmental dose-optimization program which includes automated exposure control, adjustment of the mA and/or kV according to patient size and/or use of iterative reconstruction technique. COMPARISON:  None Available. FINDINGS: CT THORACIC SPINE FINDINGS Alignment: Normal. Vertebrae: No acute fracture or focal pathologic process. Paraspinal and other soft tissues: Negative. Disc levels: No spinal canal stenosis. CT LUMBAR SPINE FINDINGS Segmentation: 5 lumbar type vertebrae. Alignment: Normal. Vertebrae: Wedge compression fracture of L1 with less than 25% height loss and no retropulsion. Paraspinal and other soft tissues: Negative. Disc levels: No spinal canal stenosis. IMPRESSION: 1. Wedge compression fracture of L1 with less than 25% height loss and no retropulsion. 2. No acute fracture or static subluxation of the thoracic  or lumbar spine. Electronically Signed   By: Deatra Robinson M.D.   On: 02/21/2022 03:35   CT L-SPINE NO CHARGE  Result Date: 02/21/2022 CLINICAL DATA:  Fall EXAM: CT THORACIC AND LUMBAR SPINE  WITHOUT CONTRAST TECHNIQUE: Multidetector CT imaging of the thoracic and lumbar spine was performed without contrast. Multiplanar CT image reconstructions were also generated. RADIATION DOSE REDUCTION: This exam was performed according to the departmental dose-optimization program which includes automated exposure control, adjustment of the mA and/or kV according to patient size and/or use of iterative reconstruction technique. COMPARISON:  None Available. FINDINGS: CT THORACIC SPINE FINDINGS Alignment: Normal. Vertebrae: No acute fracture or focal pathologic process. Paraspinal and other soft tissues: Negative. Disc levels: No spinal canal stenosis. CT LUMBAR SPINE FINDINGS Segmentation: 5 lumbar type vertebrae. Alignment: Normal. Vertebrae: Wedge compression fracture of L1 with less than 25% height loss and no retropulsion. Paraspinal and other soft tissues: Negative. Disc levels: No spinal canal stenosis. IMPRESSION: 1. Wedge compression fracture of L1 with less than 25% height loss and no retropulsion. 2. No acute fracture or static subluxation of the thoracic or lumbar spine. Electronically Signed   By: Deatra Robinson M.D.   On: 02/21/2022 03:35   CT ABDOMEN PELVIS W CONTRAST  Result Date: 02/21/2022 CLINICAL DATA:  Status post fall. EXAM: CT ABDOMEN AND PELVIS WITH CONTRAST TECHNIQUE: Multidetector CT imaging of the abdomen and pelvis was performed using the standard protocol following bolus administration of intravenous contrast. RADIATION DOSE REDUCTION: This exam was performed according to the departmental dose-optimization program which includes automated exposure control, adjustment of the mA and/or kV according to patient size and/or use of iterative reconstruction technique. CONTRAST:  OMNIPAQUE IOHEXOL 300 MG/ML  SOLN COMPARISON:  None Available. FINDINGS: Lower chest: No acute abnormality. Hepatobiliary: No focal liver abnormality is seen. No gallstones, gallbladder wall thickening, or  biliary dilatation. Pancreas: Unremarkable. No pancreatic ductal dilatation or surrounding inflammatory changes. Spleen: Normal in size without focal abnormality. Adrenals/Urinary Tract: Adrenal glands are unremarkable. Kidneys are normal, without renal calculi, focal lesion, or hydronephrosis. Bladder is unremarkable. Stomach/Bowel: Stomach is within normal limits. Appendix appears normal. No evidence of bowel wall thickening, distention, or inflammatory changes. Vascular/Lymphatic: No significant vascular findings are present. No enlarged abdominal or pelvic lymph nodes. Reproductive: The prostate gland is mildly enlarged. Other: No abdominal wall hernia or abnormality. No abdominopelvic ascites. Musculoskeletal: An acute compression fracture deformity is seen involving the L1 vertebral body. No displaced fracture fragments are identified. IMPRESSION: Acute compression fracture deformity of the L1 vertebral body. MRI correlation is recommended. Electronically Signed   By: Aram Candela M.D.   On: 02/21/2022 03:30   CT Cervical Spine Wo Contrast  Result Date: 02/21/2022 CLINICAL DATA:  Status post trauma. EXAM: CT CERVICAL SPINE WITHOUT CONTRAST TECHNIQUE: Multidetector CT imaging of the cervical spine was performed without intravenous contrast. Multiplanar CT image reconstructions were also generated. RADIATION DOSE REDUCTION: This exam was performed according to the departmental dose-optimization program which includes automated exposure control, adjustment of the mA and/or kV according to patient size and/or use of iterative reconstruction technique. COMPARISON:  None Available. FINDINGS: Alignment: Normal. Skull base and vertebrae: No acute fracture. No primary bone lesion or focal pathologic process. Soft tissues and spinal canal: No prevertebral fluid or swelling. No visible canal hematoma. Disc levels: Mild anterior osteophyte formation is seen at the level of C4-C5, with normal endplates seen  throughout the remainder of the cervical spine. Normal intervertebral disc spaces are seen throughout the  cervical spine. Normal, bilateral multilevel facet joints are noted. Upper chest: Negative. Other: None. IMPRESSION: 1. No acute fracture or subluxation in the cervical spine. Electronically Signed   By: Virgina Norfolk M.D.   On: 02/21/2022 03:26   DG Lumbar Spine 2-3 Views  Result Date: 02/20/2022 CLINICAL DATA:  Fall. EXAM: LUMBAR SPINE - 2-3 VIEW COMPARISON:  None Available. FINDINGS: There are 5 non-rib-bearing lumbar vertebra. Acute L1 compression fracture with 25% loss of height anteriorly. No definite posterior cortex involvement. No additional fracture. Slight L4-L5 and L5-S1 disc space narrowing. Minimal broad-based dextroscoliotic curvature. The sacroiliac joints are congruent. IMPRESSION: Acute L1 compression fracture with 25% loss of height anteriorly. No definite posterior cortex involvement. Electronically Signed   By: Keith Rake M.D.   On: 02/20/2022 23:08   DG Elbow Complete Left  Result Date: 02/20/2022 CLINICAL DATA:  Fall EXAM: LEFT ELBOW - COMPLETE 3+ VIEW COMPARISON:  None Available. FINDINGS: There is no evidence of fracture, dislocation, or joint effusion. There is no evidence of arthropathy or other focal bone abnormality. Soft tissues are unremarkable. IMPRESSION: Negative. Electronically Signed   By: Placido Sou M.D.   On: 02/20/2022 23:06    Procedures Procedures    Medications Ordered in ED Medications  fentaNYL (SUBLIMAZE) injection 100 mcg (100 mcg Intravenous Given 02/21/22 0215)  iohexol (OMNIPAQUE) 300 MG/ML solution 100 mL (100 mLs Intravenous Contrast Given 02/21/22 0304)    ED Course/ Medical Decision Making/ A&P Clinical Course as of 02/21/22 0454  Fri Feb 21, 2022  0222 Patient had accidental fall from ladder yesterday evening.  Initial lumbar x-ray reveals compression fracture.  Patient has a diffuse CTL tenderness.  He also has abdominal  tenderness.  Trauma imaging has been ordered.  No signs of head trauma, denies any head injury.  Will defer CT head.  No signs of any chest trauma [DW]  0454 Patient improved.  We discussed CT findings.  He has been placed in TLSO brace he will be referred to neurosurgery.  He is ambulatory.  We discussed return precautions. [DW]    Clinical Course User Index [DW] Ripley Fraise, MD         Glasgow Coma Scale Score: 15      NEXUS Criteria Score: 1            Medical Decision Making Amount and/or Complexity of Data Reviewed Radiology: ordered.  Risk Prescription drug management.   This patient presents to the ED for concern of fall, this involves an extensive number of treatment options, and is a complaint that carries with it a high risk of complications and morbidity.  The differential diagnosis includes but is not limited to lumbar fracture, thoracic spine fracture, cervical spine fracture, intra-abdominal trauma  Social Determinants of Health: Patient's lack of insurance  increases the complexity of managing their presentation  Additional history obtained: Additional history obtained from family   Lab Tests: I Ordered, and personally interpreted labs.  The pertinent results include: No lab abnormalities  Imaging Studies ordered: I ordered imaging studies including CT scan C-spine, T-spine, L-spine,  CTabd/pelvis I independently visualized and interpreted imaging which showed isolated L1 compression fracture I agree with the radiologist interpretation   Medicines ordered and prescription drug management: I ordered medication including fentanyl  for pain  Reevaluation of the patient after these medicines showed that the patient    improved  Reevaluation: After the interventions noted above, I reevaluated the patient and found that they have :improved  Complexity of  problems addressed: Patient's presentation is most consistent with  acute presentation with potential  threat to life or bodily function  Disposition: After consideration of the diagnostic results and the patient's response to treatment,  I feel that the patent would benefit from discharge   .           Final Clinical Impression(s) / ED Diagnoses Final diagnoses:  Compression fracture of L1 vertebra, initial encounter [facility])    Rx / DC Orders ED Discharge Orders          Ordered    HYDROcodone-acetaminophen (NORCO/VICODIN) 5-325 MG tablet  Every 6 hours PRN        02/21/22 0453              Zadie Rhine, MD 02/21/22 [phone]

## 2022-02-21 NOTE — ED Notes (Signed)
Ortho tech paged  

## 2022-02-21 NOTE — Progress Notes (Signed)
Orthopedic Tech Progress Note Patient Details:  Allen Mendez 11-02-1975 [id number]  Ortho Devices Type of Ortho Device: Thoracolumbar corset (TLSO) Ortho Device/Splint Location: BACK Ortho Device/Splint Interventions: Ordered, Application, Adjustment   Post Interventions Patient Tolerated: Well  Al Decant 02/21/2022, 4:28 AM

## 2022-02-21 NOTE — ED Notes (Signed)
[place] J applied per MD Bebe Shaggy

## 2022-02-21 NOTE — ED Notes (Signed)
Patient transported to CT 

## 2022-08-17 ENCOUNTER — Ambulatory Visit (HOSPITAL_COMMUNITY)
Admission: EM | Admit: 2022-08-17 | Discharge: 2022-08-17 | Disposition: A | Discharge status: Home / Self Care | Payer: Self-pay | Attending: Emergency Medicine | Admitting: Emergency Medicine

## 2022-08-17 ENCOUNTER — Encounter (HOSPITAL_COMMUNITY): Payer: Self-pay

## 2022-08-17 DIAGNOSIS — Z20822 Contact with and (suspected) exposure to covid-19: Secondary | ICD-10-CM

## 2022-08-17 DIAGNOSIS — J0140 Acute pansinusitis, unspecified: Secondary | ICD-10-CM

## 2022-08-17 DIAGNOSIS — R822 Biliuria: Secondary | ICD-10-CM

## 2022-08-17 LAB — POCT URINALYSIS DIPSTICK, ED / UC
Bilirubin Urine: NEGATIVE
Glucose, UA: NEGATIVE mg/dL
Hgb urine dipstick: NEGATIVE
Ketones, ur: NEGATIVE mg/dL
Leukocytes,Ua: NEGATIVE
Nitrite: NEGATIVE
Protein, ur: NEGATIVE mg/dL
Specific Gravity, Urine: 1.02 (ref 1.005–1.030)
Urobilinogen, UA: 2 mg/dL — ABNORMAL HIGH (ref 0.0–1.0)
pH: 7 (ref 5.0–8.0)

## 2022-08-17 MED ORDER — IPRATROPIUM BROMIDE 0.06 % NA SOLN: 2.0000 | Freq: Four times a day (QID) | NASAL | 0 refills | Status: AC

## 2022-08-17 MED ORDER — IBUPROFEN 600 MG PO TABS: 600.0000 mg | ORAL_TABLET | Freq: Four times a day (QID) | ORAL | 0 refills | Status: DC | PRN

## 2022-08-17 MED ORDER — AMOXICILLIN-POT CLAVULANATE 875-125 MG PO TABS: 1.0000 | ORAL_TABLET | Freq: Two times a day (BID) | ORAL | 0 refills | Status: DC

## 2022-08-17 NOTE — ED Provider Notes (Signed)
HPI  SUBJECTIVE:  Allen Mendez is a 47 y.o. male who presents with maxillary/frontal sinus pain and pressure, yellow rhinorrhea, postnasal drip, occasional cough and sinus headache for the past 3 days.  States that he felt feverish, but did not check his temperature as he does not have a thermometer at home.  Denies nasal congestion, facial swelling, upper dental pain, sore throat.  No body aches, loss of sense of smell or taste, wheeze, shortness of breath, vomiting, diarrhea, abdominal pain.  He does report some nausea.  He was exposed to influenza B recently.  No known COVID exposure.  He did not get the COVID or flu vaccines.  He also reports dark urine starting 2 days ago.  He states that this is resolving.  States that his stool is normal color.  States he had bilateral sharp, crampy pain underneath both of his ribs that lasted several minutes yesterday, but this has since resolved and has not recurred.  No antibiotics in the past month.  No antipyretic in the past 6 hours.  He tried ibuprofen with some improvement in his pain.  No aggravating factors.  He has no past medical history.  PCP: None.   History reviewed. No pertinent past medical history.  History reviewed. No pertinent surgical history.  History reviewed. No pertinent family history.  Social History   Tobacco Use   Smoking status: Never   Smokeless tobacco: Never  Substance Use Topics   Alcohol use: No   Drug use: No    No current facility-administered medications for this encounter.  Current Outpatient Medications:    amoxicillin-clavulanate (AUGMENTIN) 875-125 MG tablet, Take 1 tablet by mouth every 12 (twelve) hours., Disp: 14 tablet, Rfl: 0   ibuprofen (ADVIL) 600 MG tablet, Take 1 tablet (600 mg total) by mouth every 6 (six) hours as needed., Disp: 30 tablet, Rfl: 0   ipratropium (ATROVENT) 0.06 % nasal spray, Place 2 sprays into both nostrils 4 (four) times daily., Disp: 15 mL, Rfl: 0  No Known  Allergies   ROS  As noted in HPI.   Physical Exam  BP 105/71   Pulse 62   Temp 98.3 F (36.8 C)   Resp 12   SpO2 97%   Constitutional: Well developed, well nourished, no acute distress Eyes:  EOMI, conjunctiva normal bilaterally HENT: Normocephalic, atraumatic,mucus membranes moist.  Clear nasal congestion.  Normal color turbinates, but swollen nasal mucosa.  Positive maxillary, Frontal sinus tenderness.  Normal oropharynx.  Uvula midline.  Unable to visualize posterior oropharynx. Neck: Positive cervical lymphadenopathy Respiratory: Normal inspiratory effort Cardiovascular: Normal rate GI: nondistended skin: No rash, skin intact Musculoskeletal: no deformities Neurologic: Alert & oriented x 3, no focal neuro deficits Psychiatric: Speech and behavior appropriate   ED Course   Medications - No data to display  Orders Placed This Encounter  Procedures   SARS CORONAVIRUS 2 (TAT 6-24 HRS) Anterior Nasal Swab    Standing Status:   Standing    Number of Occurrences:   1   Nursing Communication Please set up with a PCP prior to discharge    Please set up with a PCP prior to discharge    Standing Status:   Standing    Number of Occurrences:   1   POCT Urinalysis Dipstick (ED/UC)    Standing Status:   Standing    Number of Occurrences:   1    Results for orders placed or performed during the hospital encounter of 08/17/22 (from the past 24  hour(s))  POCT Urinalysis Dipstick (ED/UC)     Status: Abnormal   Collection Time: 08/17/22 10:27 AM  Result Value Ref Range   Glucose, UA NEGATIVE NEGATIVE mg/dL   Bilirubin Urine NEGATIVE NEGATIVE   Ketones, ur NEGATIVE NEGATIVE mg/dL   Specific Gravity, Urine 1.020 1.005 - 1.030   Hgb urine dipstick NEGATIVE NEGATIVE   pH 7.0 5.0 - 8.0   Protein, ur NEGATIVE NEGATIVE mg/dL   Urobilinogen, UA 2.0 (H) 0.0 - 1.0 mg/dL   Nitrite NEGATIVE NEGATIVE   Leukocytes,Ua NEGATIVE NEGATIVE   No results found.  ED Clinical  Impression  1. Acute non-recurrent pansinusitis   2. Encounter for laboratory testing for COVID-19 virus   3. Bilirubinuria      ED Assessment/Plan     1.  Acute pansinusitis, discussed with patient this is most likely viral.  Deferring flu Enza testing as he is out of the treatment window.  Will start  Mucinex D, Atrovent nasal spray, saline nasal irrigation, Tylenol/ibuprofen wait-and-see prescription of Augmentin. discussed indications for starting this.  Creatinine 0.80 on 02/21/2022.  GFR was not calculated.  He has no history of chronic kidney disease.  Checking COVID as he will be a candidate for antivirals if COVID is positive due to unvaccinated status.   2.  Dark urine.  Patient states that this is clearing up.  He denies any abdominal pain currently.  UA negative for UTI, hematuria.  He has urobilinogen in his urine, but he is otherwise asymptomatic.  Doubt acute biliary obstruction in the absence of abdominal pain, jaundice.  Will have him observe this.  He will return here or follow-up with PCP if it gets worse.  Will set patient up with a PCP prior to discharge.Marland Kitchen  He will go to the ED if he starts having abdominal pain with dark urine.  Discussed labs, MDM, treatment plan, and plan for follow-up with patient. . patient agrees with plan.   Meds ordered this encounter  Medications   ibuprofen (ADVIL) 600 MG tablet    Sig: Take 1 tablet (600 mg total) by mouth every 6 (six) hours as needed.    Dispense:  30 tablet    Refill:  0   amoxicillin-clavulanate (AUGMENTIN) 875-125 MG tablet    Sig: Take 1 tablet by mouth every 12 (twelve) hours.    Dispense:  14 tablet    Refill:  0   ipratropium (ATROVENT) 0.06 % nasal spray    Sig: Place 2 sprays into both nostrils 4 (four) times daily.    Dispense:  15 mL    Refill:  0      *This clinic note was created using [employer]. Therefore, there may be occasional mistakes despite careful proofreading.  ?     Melynda Ripple, MD 08/17/22 1214

## 2022-08-17 NOTE — Discharge Instructions (Addendum)
Start Mucinex-D to keep the mucous thin and to decongest you.   You may take 600 mg of motrin with 1000 mg of tylenol up to 3-4 times a day as needed for pain. This is an effective combination for pain.  Most sinus infections are viral and do not need antibiotics unless you have a high fever, have had this for 10 days, or you get better and then get sick again. Use a NeilMed sinus rinse with distilled water as often as you want to to reduce nasal congestion. Follow the directions on the box.   If you get worse, start having fever above 102, or if you have this for more than 10 days, then start the Augmentin.  Otherwise I would wait.  We will start you on Paxlovid if your COVID comes back positive.  Keep an eye on your urine.  Please follow-up with your primary care provider and or you may return here if it persists.  Go to the ER if you start having abdominal pain.  Go to www.goodrx.com to look up your medications. This will give you a list of where you can find your prescriptions at the most affordable prices. Or you can ask the pharmacist what the cash price is. This is frequently cheaper than going through insurance.

## 2022-08-17 NOTE — ED Triage Notes (Signed)
Pt is here for possible sinus congestion, headache , fever, , blisters in mouth, cramp in the stomach , urine has been dark in color , weakness, pain and burning sensation in the lower back x 2days

## 2022-08-18 ENCOUNTER — Ambulatory Visit: Payer: Self-pay | Admitting: *Deleted

## 2022-08-18 LAB — SARS CORONAVIRUS 2 (TAT 6-24 HRS): SARS Coronavirus 2: NEGATIVE

## 2022-08-18 NOTE — Telephone Encounter (Signed)
Reason for Disposition  Side (flank) or back pain present  Answer Assessment - Initial Assessment Questions 1. COLOR of URINE: "Describe the color of the urine."  (e.g., tea-colored, pink, red, bloody) "Do you have blood clots in your urine?" (e.g., none, pea, grape, small coin)     He is having blood in his urine for a couple of months. 2. ONSET: "When did the bleeding start?"      A couple of months ago.    It's intermittently 3. EPISODES: "How many times has there been blood in the urine?" or "How many times today?"     It's a dark not red.    It happens intermittently. 4. PAIN with URINATION: "Is there any pain with passing your urine?" If Yes, ask: "How bad is the pain?"  (Scale 1-10; or mild, moderate, severe)    - MILD: Complains slightly about urination hurting.    - MODERATE: Interferes with normal activities.      - SEVERE: Excruciating, unwilling or unable to urinate because of the pain.      No burning.    Flank pain on both sides.   No abd pain.   I'm having urgency and frequency. 5. FEVER: "Do you have a fever?" If Yes, ask: "What is your temperature, how was it measured, and when did it start?"     I think a few days ago I had fever for a couple of days. 6. ASSOCIATED SYMPTOMS: "Are you passing urine more frequently than usual?"     Yes 7. OTHER SYMPTOMS: "Do you have any other symptoms?" (e.g., back/flank pain, abdomen pain, vomiting)     I'm having blisters in my mouth.    8. PREGNANCY: "Is there any chance you are pregnant?" "When was your last menstrual period?"     N/A  Protocols used: Urine - Blood In-A-AH

## 2022-08-18 NOTE — Telephone Encounter (Signed)
  Chief Complaint: New pt for [facility].   Seen at urgent care and told to make an appt.   Symptoms: Dark urine, bilateral flank pain. Frequency: Intermittently for a month Pertinent Negatives: Patient denies burning with urination Disposition: [] ED /[] Urgent Care (no appt availability in office) / [x] Appointment(In office/virtual)/ []  [facility]/ [] Home Care/ [] Refused Recommended Disposition /[] [facility]/ []  Follow-up with PCP Additional Notes: Appt. Made with Dr. Asencion Noble on 08/26/2022 at 9:50 at [facility].   I went over s/s to go back to the urgent care or ED for if they got worse before his appt. With Dr. Joya Gaskins.

## 2022-08-25 NOTE — Progress Notes (Unsigned)
New Patient Office Visit  Subjective    Patient ID: Allen Mendez, male    DOB: 1976/01/26  Age: 47 y.o. MRN: [id number]  CC: No chief complaint on file.   HPI Allen Mendez presents to establish care Dark urine and flank pain  and est care Seen in UC 08/17/22: 1.  Acute pansinusitis, discussed with patient this is most likely viral.  Deferring flu Enza testing as he is out of the treatment window.  Will start  Mucinex D, Atrovent nasal spray, saline nasal irrigation, Tylenol/ibuprofen wait-and-see prescription of Augmentin. discussed indications for starting this.   Creatinine 0.80 on 02/21/2022.  GFR was not calculated.  He has no history of chronic kidney disease.   Checking COVID as he will be a candidate for antivirals if COVID is positive due to unvaccinated status.     2.  Dark urine.  Patient states that this is clearing up.  He denies any abdominal pain currently.  UA negative for UTI, hematuria.  He has urobilinogen in his urine, but he is otherwise asymptomatic.  Doubt acute biliary obstruction in the absence of abdominal pain, jaundice.  Will have him observe this.  He will return here or follow-up with PCP if it gets worse.  Will set patient up with a PCP prior to discharge.Marland Kitchen  He will go to the ED if he starts having abdominal pain with dark urine.   Discussed labs, MDM, treatment plan, and plan for follow-up with patient. . patient agrees with plan.        Meds ordered this encounter  Medications   ibuprofen (ADVIL) 600 MG tablet      Sig: Take 1 tablet (600 mg total) by mouth every 6 (six) hours as needed.      Dispense:  30 tablet      Refill:  0   amoxicillin-clavulanate (AUGMENTIN) 875-125 MG tablet      Sig: Take 1 tablet by mouth every 12 (twelve) hours.      Dispense:  14 tablet      Refill:  0   ipratropium (ATROVENT) 0.06 % nasal spray      Sig: Place 2 sprays into both nostrils 4 (four) times daily.      Dispense:  15 mL      Refill:  0       Outpatient Encounter Medications as of 08/26/2022  Medication Sig   amoxicillin-clavulanate (AUGMENTIN) 875-125 MG tablet Take 1 tablet by mouth every 12 (twelve) hours.   ibuprofen (ADVIL) 600 MG tablet Take 1 tablet (600 mg total) by mouth every 6 (six) hours as needed.   ipratropium (ATROVENT) 0.06 % nasal spray Place 2 sprays into both nostrils 4 (four) times daily.   No facility-administered encounter medications on file as of 08/26/2022.    No past medical history on file.  No past surgical history on file.  No family history on file.  Social History   Socioeconomic History   Marital status: Married    Spouse name: Not on file   Number of children: Not on file   Years of education: Not on file   Highest education level: Not on file  Occupational History   Not on file  Tobacco Use   Smoking status: Never   Smokeless tobacco: Never  Substance and Sexual Activity   Alcohol use: No   Drug use: No   Sexual activity: Not on file  Other Topics Concern   Not on file  Social History Narrative  Not on file   Social Determinants of Health   Financial Resource Strain: Not on file  Food Insecurity: Not on file  Transportation Needs: Not on file  Physical Activity: Not on file  Stress: Not on file  Social Connections: Not on file  Intimate Partner Violence: Not on file    ROS      Objective    There were no vitals taken for this visit.  Physical Exam  {Labs (Optional):23779}    Assessment & Plan:   Problem List Items Addressed This Visit   None   No follow-ups on file.   Asencion Noble, MD

## 2022-08-26 ENCOUNTER — Encounter: Payer: Self-pay | Admitting: Critical Care Medicine

## 2022-08-26 ENCOUNTER — Other Ambulatory Visit: Payer: Self-pay

## 2022-08-26 ENCOUNTER — Ambulatory Visit: Payer: Self-pay | Attending: Critical Care Medicine | Admitting: Critical Care Medicine

## 2022-08-26 VITALS — BP 121/75 | HR 67 | Ht 67.0 in | Wt 158.2 lb

## 2022-08-26 DIAGNOSIS — R631 Polydipsia: Secondary | ICD-10-CM

## 2022-08-26 DIAGNOSIS — R82998 Other abnormal findings in urine: Secondary | ICD-10-CM

## 2022-08-26 DIAGNOSIS — Z23 Encounter for immunization: Secondary | ICD-10-CM

## 2022-08-26 DIAGNOSIS — R1011 Right upper quadrant pain: Secondary | ICD-10-CM

## 2022-08-26 DIAGNOSIS — R3589 Other polyuria: Secondary | ICD-10-CM

## 2022-08-26 DIAGNOSIS — R109 Unspecified abdominal pain: Secondary | ICD-10-CM

## 2022-08-26 DIAGNOSIS — Z1211 Encounter for screening for malignant neoplasm of colon: Secondary | ICD-10-CM

## 2022-08-26 DIAGNOSIS — Z139 Encounter for screening, unspecified: Secondary | ICD-10-CM

## 2022-08-26 HISTORY — DX: Other polyuria: R35.89

## 2022-08-26 MED ORDER — PANTOPRAZOLE SODIUM 40 MG PO TBEC
40.0000 mg | DELAYED_RELEASE_TABLET | Freq: Every day | ORAL | 3 refills | Status: DC
  Filled 2022-08-26: qty 30, 30d supply, fill #0

## 2022-08-26 NOTE — Patient Instructions (Addendum)
Complete screening labs urine sample colon cancer kit issued at the lab  Ultrasound of kidneys and abdomen obtained  Start pantoprazole daily take about 15 minutes before you eat in the morning for acid reflux  No other medications indicated we did discuss using saline spray in the nose  Return to see Dr. Joya Gaskins 4 months  Pick up application for orange card [employer] discount  We may end up sending you to gastroenterology

## 2022-08-26 NOTE — Assessment & Plan Note (Addendum)
Right upper quadrant abdominal pain and epigastric pain is concerning for gastritis and also liver disease will recheck metabolic panel and obtain abdominal ultrasound  Will start pantoprazole daily

## 2022-08-26 NOTE — Assessment & Plan Note (Signed)
Glucose not elevated in the past we will check hemoglobin [id number] and metabolic panel

## 2022-08-26 NOTE — Assessment & Plan Note (Signed)
With associated dark urine consistent with urobilirubinuria I am concerned about liver disease in this patient and also gastritis.  Also concerned about renal dysfunction  Plan to check renal ultrasound and repeat urinalysis

## 2022-08-27 ENCOUNTER — Other Ambulatory Visit: Payer: Self-pay

## 2022-08-27 LAB — LIPID PANEL
Chol/HDL Ratio: 5 ratio (ref 0.0–5.0)
Cholesterol, Total: 209 mg/dL — ABNORMAL HIGH (ref 100–199)
HDL: 42 mg/dL (ref >39)
LDL Chol Calc (NIH): 146 mg/dL — ABNORMAL HIGH (ref 0–99)
Triglycerides: 117 mg/dL (ref 0–149)
VLDL Cholesterol Cal: 21 mg/dL (ref 5–40)

## 2022-08-27 LAB — COMPREHENSIVE METABOLIC PANEL
ALT: 17 IU/L (ref 0–44)
AST: 17 IU/L (ref 0–40)
Albumin/Globulin Ratio: 1.7 (ref 1.2–2.2)
Albumin: 4.8 g/dL (ref 4.1–5.1)
Alkaline Phosphatase: 90 IU/L (ref 44–121)
BUN/Creatinine Ratio: 20 (ref 9–20)
BUN: 18 mg/dL (ref 6–24)
Bilirubin Total: 0.4 mg/dL (ref 0.0–1.2)
CO2: 23 mmol/L (ref 20–29)
Calcium: 9.8 mg/dL (ref 8.7–10.2)
Chloride: 100 mmol/L (ref 96–106)
Creatinine, Ser: 0.89 mg/dL (ref 0.76–1.27)
Globulin, Total: 2.9 g/dL (ref 1.5–4.5)
Glucose: 90 mg/dL (ref 70–99)
Potassium: 4.5 mmol/L (ref 3.5–5.2)
Sodium: 138 mmol/L (ref 134–144)
Total Protein: 7.7 g/dL (ref 6.0–8.5)
eGFR: 107 mL/min/{1.73_m2} (ref >59)

## 2022-08-27 LAB — CBC WITH DIFFERENTIAL/PLATELET
Basophils Absolute: 0 10*3/uL (ref 0.0–0.2)
Basos: 1 %
EOS (ABSOLUTE): 0 10*3/uL (ref 0.0–0.4)
Eos: 1 %
Hematocrit: 47.5 % (ref 37.5–51.0)
Hemoglobin: 15.9 g/dL (ref 13.0–17.7)
Immature Grans (Abs): 0 10*3/uL (ref 0.0–0.1)
Immature Granulocytes: 0 %
Lymphocytes Absolute: 1.4 10*3/uL (ref 0.7–3.1)
Lymphs: 26 %
MCH: 27.9 pg (ref 26.6–33.0)
MCHC: 33.5 g/dL (ref 31.5–35.7)
MCV: 84 fL (ref 79–97)
Monocytes Absolute: 0.4 10*3/uL (ref 0.1–0.9)
Monocytes: 7 %
Neutrophils Absolute: 3.6 10*3/uL (ref 1.4–7.0)
Neutrophils: 65 %
Platelets: 356 10*3/uL (ref 150–450)
RBC: 5.69 x10E6/uL (ref 4.14–5.80)
RDW: 12.7 % (ref 11.6–15.4)
WBC: 5.4 10*3/uL (ref 3.4–10.8)

## 2022-08-27 LAB — URINALYSIS
Bilirubin, UA: NEGATIVE
Glucose, UA: NEGATIVE
Ketones, UA: NEGATIVE
Leukocytes,UA: NEGATIVE
Nitrite, UA: NEGATIVE
Protein,UA: NEGATIVE
RBC, UA: NEGATIVE
Specific Gravity, UA: 1.026 (ref 1.005–1.030)
Urobilinogen, Ur: 0.2 mg/dL (ref 0.2–1.0)
pH, UA: 7 (ref 5.0–7.5)

## 2022-08-27 LAB — HEMOGLOBIN A1C
Est. average glucose Bld gHb Est-mCnc: 117 mg/dL
Hgb A1c MFr Bld: 5.7 % — ABNORMAL HIGH (ref 4.8–5.6)

## 2022-08-27 LAB — HCV INTERPRETATION

## 2022-08-27 LAB — HIV ANTIBODY (ROUTINE TESTING W REFLEX): HIV Screen 4th Generation wRfx: NONREACTIVE

## 2022-08-27 LAB — HCV AB W REFLEX TO QUANT PCR: HCV Ab: NONREACTIVE

## 2022-08-28 ENCOUNTER — Other Ambulatory Visit: Payer: Self-pay | Admitting: Critical Care Medicine

## 2022-08-28 NOTE — Progress Notes (Signed)
Let pt know urine test is normal now  hgbA1C normal, cholesterol is high follow healthy low fat diet as we discussed, liver kidney normal, blood count normal, hiv hep c neg, keep orders for ultrasound

## 2022-08-29 ENCOUNTER — Telehealth: Payer: Self-pay

## 2022-08-29 NOTE — Telephone Encounter (Signed)
-----   Message from Elsie Stain, MD sent at 08/28/2022  4:52 AM EST ----- Let pt know urine test is normal now  hgbA1C normal, cholesterol is high follow healthy low fat diet as we discussed, liver kidney normal, blood count normal, hiv hep c neg, keep orders for ultrasound

## 2022-08-29 NOTE — Telephone Encounter (Signed)
Pt was called and is aware of results, DOB was confirmed.  ?

## 2022-09-17 ENCOUNTER — Other Ambulatory Visit: Payer: Self-pay

## 2022-09-26 ENCOUNTER — Ambulatory Visit
Admission: RE | Admit: 2022-09-26 | Discharge: 2022-09-26 | Disposition: A | Discharge status: Home / Self Care | Payer: No Typology Code available for payment source | Source: Ambulatory Visit | Attending: Critical Care Medicine | Admitting: Critical Care Medicine

## 2022-09-26 DIAGNOSIS — R109 Unspecified abdominal pain: Secondary | ICD-10-CM

## 2022-09-26 DIAGNOSIS — R82998 Other abnormal findings in urine: Secondary | ICD-10-CM

## 2022-09-29 ENCOUNTER — Other Ambulatory Visit: Payer: Self-pay | Admitting: Critical Care Medicine

## 2022-09-29 ENCOUNTER — Telehealth: Payer: Self-pay

## 2022-09-29 ENCOUNTER — Ambulatory Visit
Admission: RE | Admit: 2022-09-29 | Discharge: 2022-09-29 | Disposition: A | Discharge status: Home / Self Care | Payer: Self-pay | Source: Ambulatory Visit | Attending: Critical Care Medicine | Admitting: Critical Care Medicine

## 2022-09-29 DIAGNOSIS — R1011 Right upper quadrant pain: Secondary | ICD-10-CM

## 2022-09-29 DIAGNOSIS — R109 Unspecified abdominal pain: Secondary | ICD-10-CM

## 2022-09-29 DIAGNOSIS — N401 Enlarged prostate with lower urinary tract symptoms: Secondary | ICD-10-CM

## 2022-09-29 MED ORDER — TAMSULOSIN HCL 0.4 MG PO CAPS: 0.4000 mg | ORAL_CAPSULE | Freq: Every day | ORAL | 3 refills | Status: DC

## 2022-09-29 NOTE — Telephone Encounter (Signed)
Pt was called and is aware of results, DOB was confirmed.  ?

## 2022-09-29 NOTE — Telephone Encounter (Signed)
-----   Message from Elsie Stain, MD sent at 09/29/2022  2:25 PM EDT ----- Tried calling the patient , no answer , try later and tell him ultrasounds were all normal, kidney normal no kidney stones however he has a very large prostate   I am going to sent him to urology for evaluation and I sent a prostate capsule for him to take daily which will improve urine flow and reduce pain in back and bladder Medication sent to his [facility] pharmacy

## 2022-09-29 NOTE — Progress Notes (Signed)
Tried calling the patient , no answer , try later and tell him ultrasounds were all normal, kidney normal no kidney stones however he has a very large prostate   I am going to sent him to urology for evaluation and I sent a prostate capsule for him to take daily which will improve urine flow and reduce pain in back and bladder Medication sent to his [facility] pharmacy

## 2022-09-30 NOTE — Progress Notes (Signed)
I finally reached the patient and is aware of Rx and DX and urology referral

## 2022-11-07 ENCOUNTER — Emergency Department (HOSPITAL_COMMUNITY)
Admission: EM | Admit: 2022-11-07 | Discharge: 2022-11-08 | Disposition: A | Discharge status: Home / Self Care | Payer: No Typology Code available for payment source | Attending: Emergency Medicine | Admitting: Emergency Medicine

## 2022-11-07 ENCOUNTER — Encounter (HOSPITAL_COMMUNITY): Payer: Self-pay

## 2022-11-07 DIAGNOSIS — L03316 Cellulitis of umbilicus: Secondary | ICD-10-CM

## 2022-11-07 LAB — COMPREHENSIVE METABOLIC PANEL
ALT: 18 U/L (ref 0–44)
AST: 20 U/L (ref 15–41)
Albumin: 4 g/dL (ref 3.5–5.0)
Alkaline Phosphatase: 65 U/L (ref 38–126)
Anion gap: 10 (ref 5–15)
BUN: 18 mg/dL (ref 6–20)
CO2: 23 mmol/L (ref 22–32)
Calcium: 8.8 mg/dL — ABNORMAL LOW (ref 8.9–10.3)
Chloride: 101 mmol/L (ref 98–111)
Creatinine, Ser: 0.95 mg/dL (ref 0.61–1.24)
GFR, Estimated: >60 mL/min (ref >60)
Glucose, Bld: 152 mg/dL — ABNORMAL HIGH (ref 70–99)
Potassium: 3.2 mmol/L — ABNORMAL LOW (ref 3.5–5.1)
Sodium: 134 mmol/L — ABNORMAL LOW (ref 135–145)
Total Bilirubin: 0.8 mg/dL (ref 0.3–1.2)
Total Protein: 7.4 g/dL (ref 6.5–8.1)

## 2022-11-07 LAB — CBC
HCT: 45.3 % (ref 39.0–52.0)
Hemoglobin: 15.6 g/dL (ref 13.0–17.0)
MCH: 28.6 pg (ref 26.0–34.0)
MCHC: 34.4 g/dL (ref 30.0–36.0)
MCV: 83 fL (ref 80.0–100.0)
Platelets: 275 10*3/uL (ref 150–400)
RBC: 5.46 MIL/uL (ref 4.22–5.81)
RDW: 12.3 % (ref 11.5–15.5)
WBC: 7.5 10*3/uL (ref 4.0–10.5)
nRBC: 0 % (ref 0.0–0.2)

## 2022-11-07 LAB — URINALYSIS, ROUTINE W REFLEX MICROSCOPIC
Bilirubin Urine: NEGATIVE
Glucose, UA: NEGATIVE mg/dL
Hgb urine dipstick: NEGATIVE
Ketones, ur: 5 mg/dL — AB
Leukocytes,Ua: NEGATIVE
Nitrite: NEGATIVE
Protein, ur: NEGATIVE mg/dL
Specific Gravity, Urine: 1.032 — ABNORMAL HIGH (ref 1.005–1.030)
pH: 5 (ref 5.0–8.0)

## 2022-11-07 LAB — LIPASE, BLOOD: Lipase: 30 U/L (ref 11–51)

## 2022-11-07 NOTE — ED Triage Notes (Signed)
Pt states that he has been having abd pain in the umbilical area for the past few days, pt states that he has also has been feeling dizzy like he is going to pass out.

## 2022-11-08 ENCOUNTER — Emergency Department (HOSPITAL_COMMUNITY): Payer: No Typology Code available for payment source

## 2022-11-08 MED ORDER — IOHEXOL 350 MG/ML SOLN
75.0000 mL | Freq: Once | INTRAVENOUS | Status: AC | PRN
  Administered 2022-11-08: 75 mL via INTRAVENOUS

## 2022-11-08 MED ORDER — DOXYCYCLINE HYCLATE 100 MG PO CAPS: 100.0000 mg | ORAL_CAPSULE | Freq: Two times a day (BID) | ORAL | 0 refills | Status: DC

## 2022-11-08 MED ORDER — SODIUM CHLORIDE 0.9 % IV BOLUS
1000.0000 mL | Freq: Once | INTRAVENOUS | Status: AC
  Administered 2022-11-08: 1000 mL via INTRAVENOUS

## 2022-11-08 MED ORDER — HYDROCODONE-ACETAMINOPHEN 5-325 MG PO TABS
1.0000 | ORAL_TABLET | Freq: Once | ORAL | Status: AC
  Administered 2022-11-08: 1 via ORAL
  Filled 2022-11-08: qty 1

## 2022-11-08 NOTE — ED Notes (Signed)
Pt does report pain at the umbilicus area.  The umbilicus is draining fluid and has crusted over.

## 2022-11-08 NOTE — ED Provider Notes (Signed)
EMERGENCY DEPARTMENT AT [facility] Provider Note   CSN: [id number] Arrival date & time: 11/07/22  2229     History  Chief Complaint  Patient presents with   Abdominal Pain    Allen Mendez is a 47 y.o. male.  Patient presents to the emergency department complaining of umbilical pain for the past 2 days.  Patient has also had some intermittent feeling of lightheadedness.  He denies nausea, vomiting, chest pain, shortness of breath, urinary symptoms, diarrhea, fevers.  Past medical history significant for BPH  HPI     Home Medications Prior to Admission medications   Medication Sig Start Date End Date Taking? Authorizing Provider  doxycycline (VIBRAMYCIN) 100 MG capsule Take 1 capsule (100 mg total) by mouth 2 (two) times daily. 11/08/22  Yes Darrick Grinder, PA-C  tamsulosin (FLOMAX) 0.4 MG CAPS capsule Take 1 capsule (0.4 mg total) by mouth daily. 09/29/22   Storm Frisk, MD  ipratropium (ATROVENT) 0.06 % nasal spray Place 2 sprays into both nostrils 4 (four) times daily. 08/17/22   Domenick Gong, MD  pantoprazole (PROTONIX) 40 MG tablet Take 1 tablet (40 mg total) by mouth daily. 08/26/22   Storm Frisk, MD      Allergies    Patient has no known allergies.    Review of Systems   Review of Systems  Physical Exam Updated Vital Signs BP 116/77 (BP Location: Left Arm)   Pulse (!) 50   Temp 97.7 F (36.5 C) (Oral)   Resp 14   Ht 5\' 5"  (1.651 m)   Wt 72.6 kg   SpO2 100%   BMI 26.63 kg/m  Physical Exam Vitals and nursing note reviewed.  Constitutional:      General: He is not in acute distress.    Appearance: He is well-developed.  HENT:     Head: Normocephalic and atraumatic.  Eyes:     Conjunctiva/sclera: Conjunctivae normal.  Cardiovascular:     Rate and Rhythm: Normal rate and regular rhythm.     Heart sounds: No murmur heard. Pulmonary:     Effort: Pulmonary effort is normal. No respiratory distress.     Breath  sounds: Normal breath sounds.  Abdominal:     Palpations: Abdomen is soft.     Tenderness: There is abdominal tenderness in the periumbilical area.  Musculoskeletal:        General: No swelling.     Cervical back: Neck supple.  Skin:    General: Skin is warm and dry.     Capillary Refill: Capillary refill takes less than 2 seconds.     Comments: Mild erythema noted in the periumbilical/umbilical region consistent with cellulitis.  Neurological:     Mental Status: He is alert.  Psychiatric:        Mood and Affect: Mood normal.     ED Results / Procedures / Treatments   Labs (all labs ordered are listed, but only abnormal results are displayed) Labs Reviewed  COMPREHENSIVE METABOLIC PANEL - Abnormal; Notable for the following components:      Result Value   Sodium 134 (*)    Potassium 3.2 (*)    Glucose, Bld 152 (*)    Calcium 8.8 (*)    All other components within normal limits  URINALYSIS, ROUTINE W REFLEX MICROSCOPIC - Abnormal; Notable for the following components:   Color, Urine AMBER (*)    Specific Gravity, Urine 1.032 (*)    Ketones, ur 5 (*)  All other components within normal limits  LIPASE, BLOOD  CBC    EKG None  Radiology CT ABDOMEN PELVIS W CONTRAST  Result Date: 11/08/2022 CLINICAL DATA:  47 year old male with history of acute onset of nonlocalized abdominal pain. EXAM: CT ABDOMEN AND PELVIS WITH CONTRAST TECHNIQUE: Multidetector CT imaging of the abdomen and pelvis was performed using the standard protocol following bolus administration of intravenous contrast. RADIATION DOSE REDUCTION: This exam was performed according to the departmental dose-optimization program which includes automated exposure control, adjustment of the mA and/or kV according to patient size and/or use of iterative reconstruction technique. CONTRAST:  75mL OMNIPAQUE IOHEXOL 350 MG/ML SOLN COMPARISON:  CT of the abdomen and pelvis 02/21/2022. FINDINGS: Lower chest: Unremarkable.  Hepatobiliary: Subcentimeter low-attenuation lesions in the liver, too small to definitively characterize, but similar to the prior study and statistically likely to represent tiny cysts or biliary hamartomas (no imaging follow-up recommended). No other aggressive appearing hepatic lesions. No intra or extrahepatic biliary ductal dilatation. Gallbladder is unremarkable in appearance. Pancreas: No pancreatic mass. No pancreatic ductal dilatation. No pancreatic or peripancreatic fluid collections or inflammatory changes. Spleen: Unremarkable. Adrenals/Urinary Tract: Bilateral kidneys and bilateral adrenal glands are normal in appearance. No hydroureteronephrosis. Urinary bladder is normal in appearance. Stomach/Bowel: The appearance of the stomach is normal. No pathologic dilatation of small bowel or colon. Normal appendix. Vascular/Lymphatic: No significant atherosclerotic disease, aneurysm or dissection noted in the abdominal or pelvic vasculature. No lymphadenopathy noted in the abdomen or pelvis. Reproductive: Prostate gland and seminal vesicles are unremarkable in appearance. Other: No significant volume of ascites.  No pneumoperitoneum. Musculoskeletal: Soft tissue thickening and surrounding soft tissue stranding in the fat adjacent to the umbilicus. Chronic appearing compression fracture of L1 with 25% loss of anterior vertebral body height, unchanged. There are no aggressive appearing lytic or blastic lesions noted in the visualized portions of the skeleton. IMPRESSION: 1. Soft tissue thickening and surrounding fat stranding adjacent to the umbilicus. Clinical correlation for signs and symptoms of cellulitis in the umbilical region is recommended. 2. No other acute findings are noted elsewhere in the abdomen or pelvis to account for the patient's symptoms. Electronically Signed   By: Trudie Reed M.D.   On: 11/08/2022 08:08    Procedures Procedures    Medications Ordered in ED Medications  sodium  chloride 0.9 % bolus 1,000 mL (0 mLs Intravenous Stopped 11/08/22 0731)  HYDROcodone-acetaminophen (NORCO/VICODIN) 5-325 MG per tablet 1 tablet (1 tablet Oral Given 11/08/22 0733)  iohexol (OMNIPAQUE) 350 MG/ML injection 75 mL (75 mLs Intravenous Contrast Given 11/08/22 0747)    ED Course/ Medical Decision Making/ A&P                             Medical Decision Making Amount and/or Complexity of Data Reviewed Labs: ordered.  Risk Prescription drug management.   This patient presents to the ED for concern of periumbilical abdominal pain, this involves an extensive number of treatment options, and is a complaint that carries with it a high risk of complications and morbidity.  The differential diagnosis includes appendicitis, cellulitis, abscess, others   Co morbidities that complicate the patient evaluation  BPH   Additional history obtained:   External records from outside source obtained and reviewed including notes from his primary care provider when patient was evaluated earlier this year for right upper quadrant abdominal pain, polydipsia, polyuria   Lab Tests:  I Ordered, and personally interpreted labs.  The  pertinent results include: Grossly unremarkable CMP, CBC, lipase, UA   Imaging Studies ordered:  I ordered imaging studies including CT abdomen pelvis with contrast I independently visualized and interpreted imaging which showed  1. Soft tissue thickening and surrounding fat stranding adjacent to  the umbilicus. Clinical correlation for signs and symptoms of  cellulitis in the umbilical region is recommended.  2. No other acute findings are noted elsewhere in the abdomen or  pelvis to account for the patient's symptoms   I agree with the radiologist interpretation   Cardiac Monitoring: / EKG:  The patient was maintained on a cardiac monitor.  I personally viewed and interpreted the cardiac monitored which showed an underlying rhythm of: Sinus  rhythm   Problem List / ED Course / Critical interventions / Medication management   I ordered medication including saline bolus for fluid resuscitation, hydrocodone for pain Reevaluation of the patient after these medicines showed that the patient improved I have reviewed the patients home medicines and have made adjustments as needed   Social Determinants of Health:  Patient with no current health insurance   Test / Admission - Considered:  Patient with apparent cellulitis in the umbilical region.  No abscess noted on CT scan.  No other intra-abdominal abnormalities noted.  Plan to discharge home with doxycycline with recommendations for follow-up at [facility].  Patient voices understanding with plan and will follow-up as instructed.  Discharge home.        Final Clinical Impression(s) / ED Diagnoses Final diagnoses:  Cellulitis of umbilicus    Rx / DC Orders ED Discharge Orders          Ordered    doxycycline (VIBRAMYCIN) 100 MG capsule  2 times daily        11/08/22 0856              Darrick Grinder, PA-C 11/08/22 0856    Rondel Baton, MD 11/09/22 1002

## 2022-11-08 NOTE — Discharge Instructions (Signed)
You were diagnosed today with cellulitis, and infection of the skin.  Please take the prescribed antibiotics and follow-up [place] [facility] for further evaluation and management as needed.  If you develop worsening of the redness of the area or develop high fevers, nausea, or vomiting, please return to the emergency department.

## 2022-12-25 ENCOUNTER — Ambulatory Visit: Payer: Self-pay | Attending: Critical Care Medicine | Admitting: Critical Care Medicine

## 2022-12-25 ENCOUNTER — Encounter: Payer: Self-pay | Admitting: Critical Care Medicine

## 2022-12-25 VITALS — BP 95/56 | HR 54 | Ht 67.0 in | Wt 156.4 lb

## 2022-12-25 DIAGNOSIS — R109 Unspecified abdominal pain: Secondary | ICD-10-CM

## 2022-12-25 DIAGNOSIS — R1011 Right upper quadrant pain: Secondary | ICD-10-CM

## 2022-12-25 DIAGNOSIS — R3916 Straining to void: Secondary | ICD-10-CM

## 2022-12-25 DIAGNOSIS — R3589 Other polyuria: Secondary | ICD-10-CM

## 2022-12-25 DIAGNOSIS — R822 Biliuria: Secondary | ICD-10-CM

## 2022-12-25 DIAGNOSIS — R7303 Prediabetes: Secondary | ICD-10-CM

## 2022-12-25 DIAGNOSIS — N401 Enlarged prostate with lower urinary tract symptoms: Secondary | ICD-10-CM

## 2022-12-25 NOTE — Assessment & Plan Note (Signed)
Referral to gastroenterology

## 2022-12-25 NOTE — Patient Instructions (Signed)
Obtain application for orange card get at desk and then make appt with financial counselor  Referral to stomach doctor will be made  Repeat urine sample today  Return Dr Delford Field 4 months

## 2022-12-25 NOTE — Assessment & Plan Note (Signed)
Patient will continue tamsulosin

## 2022-12-25 NOTE — Assessment & Plan Note (Signed)
Concerns related to excess bilirubin refer to gastroenterology

## 2022-12-25 NOTE — Assessment & Plan Note (Signed)
Resolved

## 2022-12-25 NOTE — Progress Notes (Signed)
New Patient Office Visit  Subjective    Patient ID: Allen Mendez, male    DOB: 1976/03/01  Age: 47 y.o. MRN: [id number]  CC:  Chief Complaint  Patient presents with   Follow-up    Flank pain 1    HPI 08/26/22  [name] presents to establish care This is a pleasant 47 year old male comes in after follow-up for urgent care visit that occurred on February 11.  At that time he had pansinusitis treated with Atrovent Mucinex saline irrigation and antibiotic.  At the time he was there he had dark urine and flank pain.  He has had the dark urine for over a year it comes and goes every 2 to 3 days.  He also has bilateral flank pain with this.  He has no blood but it simply dark urine.  Previous urinalyses twice showed increased uro bilirubinuria Patient also notices polydipsia and polyuria he does not drink alcohol he has had epigastric pain also for 4 years he was given Pepcid with minimal improvement.  He was a heavy drinker sixpack a day for many years but quit drinking alcohol 11 years ago.  Blood pressure on arrival is 121/75.  12/25/22 The patient is seen in follow-up and visit assisted by the patient's himself as he speaks [place].  The patient is here for follow-up of blood pressure and abdominal pain.  Blood pressure is normal at this visit.  He was seen in the emergency room for an umbilicus all cellulitis he received doxycycline this improved.  He has had this condition repeatedly.  Also has problems with increased bilirubin and urine.  The patient had abdominal ultrasound which was normal.  He had bilateral renal ultrasound that was normal.  He has BPH symptoms and had an elevated increase BPH condition on his ultrasounds.  He still having right upper quadrant abdominal pain.  Protonix has not been beneficial to this.  We have been reluctant do a CT of the abdomen as he is under insured.  Outpatient Encounter Medications as of 12/25/2022  Medication Sig   tamsulosin  (FLOMAX) 0.4 MG CAPS capsule Take 1 capsule (0.4 mg total) by mouth daily.   doxycycline (VIBRAMYCIN) 100 MG capsule Take 1 capsule (100 mg total) by mouth 2 (two) times daily. (Patient not taking: Reported on 12/25/2022)   ipratropium (ATROVENT) 0.06 % nasal spray Place 2 sprays into both nostrils 4 (four) times daily. (Patient not taking: Reported on 12/25/2022)   pantoprazole (PROTONIX) 40 MG tablet Take 1 tablet (40 mg total) by mouth daily. (Patient not taking: Reported on 12/25/2022)   No facility-administered encounter medications on file as of 12/25/2022.    Past Medical History:  Diagnosis Date   Hyperlipidemia    Polyuria 08/26/2022    History reviewed. No pertinent surgical history.  Family History  Problem Relation Age of Onset   Asthma Father    Asthma Daughter    Asthma Maternal Grandmother     Social History   Socioeconomic History   Marital status: Married    Spouse name: Not on file   Number of children: 2   Years of education: Not on file   Highest education level: Not on file  Occupational History   Occupation: framer houses porches  Tobacco Use   Smoking status: Never   Smokeless tobacco: Never  Vaping Use   Vaping Use: Never used  Substance and Sexual Activity   Alcohol use: No   Drug use: No   Sexual activity: Yes  Other Topics Concern   Not on file  Social History Narrative   Not on file   Social Determinants of Health   Financial Resource Strain: Not on file  Food Insecurity: Not on file  Transportation Needs: Not on file  Physical Activity: Not on file  Stress: Not on file  Social Connections: Not on file  Intimate Partner Violence: Not on file    Review of Systems  Constitutional:  Negative for chills, diaphoresis, fever, malaise/fatigue and weight loss.  HENT:  Negative for congestion, hearing loss, nosebleeds, sinus pain, sore throat and tinnitus.   Eyes:  Negative for blurred vision, photophobia and redness.  Respiratory:   Negative for cough, hemoptysis, sputum production, shortness of breath, wheezing and stridor.   Cardiovascular:  Negative for chest pain, palpitations, orthopnea, claudication, leg swelling and PND.  Gastrointestinal:  Positive for abdominal pain. Negative for blood in stool, constipation, diarrhea, heartburn, melena, nausea and vomiting.  Genitourinary:  Positive for frequency. Negative for dysuria, flank pain, hematuria and urgency.       Had dark urine , comes and goes for two days then better   One year hx  Musculoskeletal:  Positive for back pain. Negative for falls, joint pain, myalgias and neck pain.  Skin:  Negative for itching and rash.  Neurological:  Negative for dizziness, tingling, tremors, sensory change, speech change, focal weakness, seizures, loss of consciousness, weakness and headaches.  Endo/Heme/Allergies:  Negative for environmental allergies and polydipsia. Does not bruise/bleed easily.       Nocturia  Psychiatric/Behavioral:  Negative for depression, hallucinations, memory loss, substance abuse and suicidal ideas. The patient is not nervous/anxious and does not have insomnia.         Objective    BP (!) 95/56   Pulse (!) 54   Ht 5\' 7"  (1.702 m)   Wt 156 lb 6.4 oz (70.9 kg)   SpO2 98%   BMI 24.50 kg/m   Physical Exam Vitals reviewed.  Constitutional:      Appearance: Normal appearance. He is well-developed. He is not diaphoretic.  HENT:     Head: Normocephalic and atraumatic.     Right Ear: Tympanic membrane normal.     Left Ear: Tympanic membrane normal.     Nose: No nasal deformity, septal deviation, mucosal edema or congestion.     Right Sinus: No maxillary sinus tenderness or frontal sinus tenderness.     Left Sinus: No maxillary sinus tenderness or frontal sinus tenderness.     Mouth/Throat:     Mouth: Mucous membranes are moist.     Pharynx: Oropharynx is clear. No oropharyngeal exudate.  Eyes:     General: No scleral icterus.     Conjunctiva/sclera: Conjunctivae normal.     Pupils: Pupils are equal, round, and reactive to light.  Neck:     Thyroid: No thyromegaly.     Vascular: No carotid bruit or JVD.     Trachea: Trachea normal. No tracheal tenderness or tracheal deviation.  Cardiovascular:     Rate and Rhythm: Normal rate and regular rhythm.     Chest Wall: PMI is not displaced.     Pulses: Normal pulses. No decreased pulses.     Heart sounds: Normal heart sounds, S1 normal and S2 normal. Heart sounds not distant. No murmur heard.    No systolic murmur is present.     No diastolic murmur is present.     No friction rub. No gallop. No S3 or S4 sounds.  Pulmonary:  Effort: No tachypnea, accessory muscle usage or respiratory distress.     Breath sounds: No stridor. No decreased breath sounds, wheezing, rhonchi or rales.  Chest:     Chest wall: No tenderness.  Abdominal:     General: Bowel sounds are normal. There is no distension.     Palpations: Abdomen is soft. Abdomen is not rigid.     Tenderness: There is abdominal tenderness. There is no right CVA tenderness, left CVA tenderness, guarding or rebound.  Musculoskeletal:        General: Normal range of motion.     Cervical back: Normal range of motion and neck supple. No edema, erythema or rigidity. No muscular tenderness. Normal range of motion.  Lymphadenopathy:     Head:     Right side of head: No submental or submandibular adenopathy.     Left side of head: No submental or submandibular adenopathy.     Cervical: No cervical adenopathy.  Skin:    General: Skin is warm and dry.     Coloration: Skin is not pale.     Findings: No rash.     Nails: There is no clubbing.  Neurological:     Mental Status: He is alert and oriented to person, place, and time.     Sensory: No sensory deficit.  Psychiatric:        Speech: Speech normal.        Behavior: Behavior normal.         Assessment & Plan:   Problem List Items Addressed This Visit        Other   RUQ abdominal pain    Concerns related to excess bilirubin refer to gastroenterology      Relevant Orders   Ambulatory referral to Gastroenterology   Benign prostatic hyperplasia (BPH) with straining on urination    Patient will continue tamsulosin      Bilirubin in urine    Referral to gastroenterology      Relevant Orders   Ambulatory referral to Gastroenterology   Urinalysis   RESOLVED: Bilateral flank pain    This is resolved renal ultrasound normal      RESOLVED: Polyuria    Resolved no evidence of diabetes      RESOLVED: Prediabetes - Primary    Resolved      Return in about 4 months (around 04/26/2023) for primary care follow up, chronic conditions.   Shan Levans, MD

## 2022-12-25 NOTE — Assessment & Plan Note (Signed)
Resolved no evidence of diabetes

## 2022-12-25 NOTE — Assessment & Plan Note (Signed)
This is resolved renal ultrasound normal

## 2022-12-26 LAB — URINALYSIS
Bilirubin, UA: NEGATIVE
Glucose, UA: NEGATIVE
Ketones, UA: NEGATIVE
Leukocytes,UA: NEGATIVE
Nitrite, UA: NEGATIVE
RBC, UA: NEGATIVE
Specific Gravity, UA: >=1.03 — AB (ref 1.005–1.030)
Urobilinogen, Ur: 1 mg/dL (ref 0.2–1.0)
pH, UA: 7 (ref 5.0–7.5)

## 2022-12-27 NOTE — Progress Notes (Signed)
Let patient know urine is improved keep GI appt

## 2022-12-30 ENCOUNTER — Telehealth: Payer: Self-pay

## 2022-12-30 NOTE — Telephone Encounter (Signed)
-----   Message from Storm Frisk, MD sent at 12/27/2022  8:42 AM EDT ----- Let patient know urine is improved keep GI appt

## 2022-12-30 NOTE — Telephone Encounter (Signed)
Pt was called and is aware of results, DOB was confirmed.  ?

## 2023-01-18 ENCOUNTER — Other Ambulatory Visit: Payer: Self-pay | Admitting: Critical Care Medicine

## 2023-01-19 NOTE — Telephone Encounter (Signed)
Requested medication (s) are due for refill today: Yes  Requested medication (s) are on the active medication list: Yes  Last refill:  09/29/22 #30 3RF  Future visit scheduled: Yes  Notes to clinic:  Unable to refill per protocol due to failed labs, no updated results.      Requested Prescriptions  Pending Prescriptions Disp Refills   tamsulosin (FLOMAX) 0.4 MG CAPS capsule [Pharmacy Med Name: TAMSULOSIN 0.4MG  CAPSULES] 30 capsule 3    Sig: TAKE 1 CAPSULE(0.4 MG) BY MOUTH DAILY     Urology: Alpha-Adrenergic Blocker Failed - 01/18/2023  8:01 AM      Failed - PSA in normal range and within 360 days    No results found for: "LABPSA", "PSA", "PSA1", "ULTRAPSA"       Passed - Last BP in normal range    BP Readings from Last 1 Encounters:  12/25/22 (!) 95/56         Passed - Valid encounter within last 12 months    Recent Outpatient Visits           3 weeks ago Bilirubin in urine   [facility] Storm Frisk, MD   4 months ago RUQ abdominal pain   [facility] Storm Frisk, MD       Future Appointments             In 3 months Delford Field Charlcie Cradle, MD [facility]

## 2023-03-03 ENCOUNTER — Encounter: Payer: Self-pay | Admitting: Critical Care Medicine

## 2023-04-30 ENCOUNTER — Encounter: Payer: Self-pay | Admitting: Physician Assistant

## 2023-04-30 ENCOUNTER — Ambulatory Visit: Payer: Self-pay | Attending: Critical Care Medicine | Admitting: Physician Assistant

## 2023-04-30 VITALS — BP 129/83 | HR 48 | Wt 162.4 lb

## 2023-04-30 DIAGNOSIS — E785 Hyperlipidemia, unspecified: Secondary | ICD-10-CM

## 2023-04-30 DIAGNOSIS — R1032 Left lower quadrant pain: Secondary | ICD-10-CM

## 2023-04-30 DIAGNOSIS — M25561 Pain in right knee: Secondary | ICD-10-CM

## 2023-04-30 DIAGNOSIS — N401 Enlarged prostate with lower urinary tract symptoms: Secondary | ICD-10-CM

## 2023-04-30 DIAGNOSIS — R3916 Straining to void: Secondary | ICD-10-CM

## 2023-04-30 DIAGNOSIS — M25562 Pain in left knee: Secondary | ICD-10-CM

## 2023-04-30 DIAGNOSIS — Z2821 Immunization not carried out because of patient refusal: Secondary | ICD-10-CM

## 2023-04-30 DIAGNOSIS — R1013 Epigastric pain: Secondary | ICD-10-CM

## 2023-04-30 MED ORDER — TAMSULOSIN HCL 0.4 MG PO CAPS
0.4000 mg | ORAL_CAPSULE | Freq: Every day | ORAL | 1 refills | Status: DC
Start: 2023-04-30 — End: 2023-11-20

## 2023-04-30 MED ORDER — PANTOPRAZOLE SODIUM 40 MG PO TBEC
40.0000 mg | DELAYED_RELEASE_TABLET | Freq: Every day | ORAL | 3 refills | Status: DC
Start: 2023-04-30 — End: 2023-11-20

## 2023-04-30 MED ORDER — MELOXICAM 15 MG PO TABS
15.0000 mg | ORAL_TABLET | Freq: Every day | ORAL | 2 refills | Status: AC
Start: 2023-04-30 — End: ?

## 2023-04-30 NOTE — Progress Notes (Signed)
Patient ID: Allen Mendez, male   DOB: 1976/06/30, 47 y.o.   MRN: [id number]   Allen Mendez, is a 47 y.o. male  WUJ:[id number]  [id number]  DOB - [birth year]  Chief Complaint  Patient presents with   Medical Management of Chronic Issues       Subjective:   Allen Mendez is a 47 y.o. male here today for med RF.  Still taking flomax and needs RF.  Dyspepsia improved and only taking pantoprazole as needed.  He is c/o B knee and bottom of foot pain and pain from L knee shoots into groin with certain movements.  NKI.  This has been going on for a few months.  He works as a Surveyor, minerals.  Climbs up and down ladders a lot.  No paresthesias.  Has not taken anything for it. No fevers.  No tick bites  Was told cholesterol is high and has been reducing greasy foods.  He is fasting today.  Last LDL=146  No problems updated.  ALLERGIES: No Known Allergies  PAST MEDICAL HISTORY: Past Medical History:  Diagnosis Date   Hyperlipidemia    Polyuria 08/26/2022    MEDICATIONS AT HOME: Prior to Admission medications   Medication Sig Start Date End Date Taking? Authorizing Provider  meloxicam (MOBIC) 15 MG tablet Take 1 tablet (15 mg total) by mouth daily. Prn pain 04/30/23  Yes Anders Simmonds, PA-C  ipratropium (ATROVENT) 0.06 % nasal spray Place 2 sprays into both nostrils 4 (four) times daily. 08/17/22   Domenick Gong, MD  pantoprazole (PROTONIX) 40 MG tablet Take 1 tablet (40 mg total) by mouth daily. 04/30/23   Anders Simmonds, PA-C  tamsulosin (FLOMAX) 0.4 MG CAPS capsule Take 1 capsule (0.4 mg total) by mouth daily. 04/30/23   Marie Chow, Marzella Schlein, PA-C    ROS: Neg HEENT Neg resp Neg cardiac Neg GI Neg GU Neg psych Neg neuro  Objective:   Vitals:   04/30/23 0840  BP: 129/83  Pulse: (!) 48  SpO2: 100%  Weight: 162 lb 6.4 oz (73.7 kg)   Exam General appearance : Awake, alert, not in any distress. Speech Clear. Not toxic looking HEENT:  Atraumatic and Normocephalic Neck: Supple, no JVD. No cervical lymphadenopathy.  Chest: Good air entry bilaterally, CTAB.  No rales/rhonchi/wheezing CVS: S1 S2 regular, no murmurs.  Full ROM with B knees.  No ballotment or swelling.  Ligaments are stable B.  B mild crepitus.  R and L hip also examined.  Decreased ROM B internal and external rotation. L hip painful into groin with external rotation of L hip Extremities: B/L Lower Ext shows no edema, both legs are warm to touch Neurology: Awake alert, and oriented X 3, CN II-XII intact, Non focal Skin: No Rash  Data Review Lab Results  Component Value Date   HGBA1C 5.7 (H) 08/26/2022    Assessment & Plan   1. Pain in both knees, unspecified chronicity Likely arthritic or deconditiong - Vitamin D, 25-hydroxy - meloxicam (MOBIC) 15 MG tablet; Take 1 tablet (15 mg total) by mouth daily. Prn pain  Dispense: 30 tablet; Refill: 2 - Ambulatory referral to Orthopedic Surgery  2. Dyslipidemia Will start statin if still elevated bc he has already made diet changes - Lipid Panel - CMP14+EGFR  3. Dyspepsia - pantoprazole (PROTONIX) 40 MG tablet; Take 1 tablet (40 mg total) by mouth daily.  Dispense: 30 tablet; Refill: 3  4. Left inguinal pain - Vitamin D, 25-hydroxy - meloxicam (MOBIC) 15 MG tablet; Take 1  tablet (15 mg total) by mouth daily. Prn pain  Dispense: 30 tablet; Refill: 2 - Ambulatory referral to Orthopedic Surgery  5. Influenza vaccination declined  6. Benign prostatic hyperplasia (BPH) with straining on urination - tamsulosin (FLOMAX) 0.4 MG CAPS capsule; Take 1 capsule (0.4 mg total) by mouth daily.  Dispense: 90 capsule; Refill: 1    Return in about 6 months (around 10/29/2023) for PCP for chronic conditions-assign PCP.  The patient was given clear instructions to go to ER or return to medical center if symptoms don't improve, worsen or new problems develop. The patient verbalized understanding. The patient was told to  call to get lab results if they haven't heard anything in the next week.      Georgian Co, PA-C [facility] [place], [place] [phone]   04/30/2023, 8:58 AM

## 2023-04-30 NOTE — Patient Instructions (Signed)
Exercises for Chronic Knee Pain Chronic knee pain is pain that lasts longer than 3 months. For most people with chronic knee pain, exercise and weight loss is an important part of treatment. Your health care provider may want you to focus on: Making the muscles that support your knee stronger. This can take pressure off your knee and reduce pain. Preventing knee stiffness. How far you can move your knee, keeping it there or making it farther. Losing weight (if this applies) to take pressure off your knee, lower your risk for injury, and make it easier for you to exercise. Your provider will help you make an exercise program that fits your needs and physical abilities. Below are simple, low-impact exercises you can do at home. Ask your provider or physical therapist how often you should do your exercise program and how many times to repeat each exercise. General safety tips  Get your provider's approval before doing any exercises. Start slowly and stop any time you feel pain. Do not exercise if your knee pain is flaring up. Warm up first. Stretching a cold muscle can cause an injury. Do 5-10 minutes of easy movement or light stretching before beginning your exercises. Do 5-10 minutes of low-impact activity (like walking or cycling) before starting strengthening exercises. Contact your provider any time you have pain during or after exercising. Exercise can cause discomfort but should not be painful. It is normal to be a little stiff or sore after exercising. Stretching and range-of-motion exercises Front thigh stretch  Stand up straight and support your body by holding on to a chair or resting one hand on a wall. With your legs straight and close together, bend one knee to lift your heel up toward your butt. Using one hand for support, grab your ankle with your free hand. Pull your foot up closer toward your butt to feel the stretch in front of your thigh. Hold the stretch for 30  seconds. Repeat __________ times. Complete this exercise __________ times a day. Back thigh stretch  Sit on the floor with your back straight and your legs out straight in front of you. Place the palms of your hands on the floor and slide them toward your feet as you bend at the hip. Try to touch your nose to your knees and feel the stretch in the back of your thighs. Hold for 30 seconds. Repeat __________ times. Complete this exercise __________ times a day. Calf stretch  Stand facing a wall. Place the palms of your hands flat against the wall, arms extended, and lean slightly against the wall. Get into a lunge position with one leg bent at the knee and the other leg stretched out straight behind you. Keep both feet facing the wall and increase the bend in your knee while keeping the heel of the other leg flat on the ground. You should feel the stretch in your calf. Hold for 30 seconds. Repeat __________ times. Complete this exercise __________ times a day. Strengthening exercises Straight leg lift  Lie on your back with one knee bent and the other leg out straight. Slowly lift the straight leg without bending the knee. Lift until your foot is about 12 inches (30 cm) off the floor. Hold for 3-5 seconds and slowly lower your leg. Repeat __________ times. Complete this exercise __________ times a day. Single leg dip  Stand between two chairs and put both hands on the backs of the chairs for support. Extend one leg out straight with your body   weight resting on the heel of the standing leg. Slowly bend your standing knee to dip your body to the level that is comfortable for you. Hold for 3-5 seconds. Repeat __________ times. Complete this exercise __________ times a day. Hamstring curls  Stand straight, knees close together, facing the back of a chair. Hold on to the back of a chair with both hands. Keep one leg straight. Bend the other knee while bringing the heel up toward the butt  until the knee is bent at a 90-degree angle (right angle). Hold for 3-5 seconds. Repeat __________ times. Complete this exercise __________ times a day. Wall squat  Stand straight with your back, hips, and head against a wall. Step forward one foot at a time with your back still against the wall. Your feet should be 2 feet (61 cm) from the wall at shoulder width. Keeping your back, hips, and head against the wall, slide down the wall to as close to a sitting position as you can get. Hold for 5-10 seconds, then slowly slide back up. Repeat __________ times. Complete this exercise __________ times a day. Step-ups  Stand in front of a sturdy platform or stool that is about 6 inches (15 cm) high. Slowly step up with your left / right foot, keeping your knee in line with your hip and foot. Do not let your knee bend so far that you cannot see your toes. Hold on to a chair for balance, but do not use it for support. Slowly unlock your knee and lower yourself to the starting position. Repeat __________ times. Complete this exercise __________ times a day. Contact a health care provider if: Your exercises cause pain. Your pain is worse after you exercise. Your pain prevents you from doing your exercises. This information is not intended to replace advice given to you by your health care provider. Make sure you discuss any questions you have with your health care provider. Document Revised: 07/08/2022 Document Reviewed: 07/08/2022 Elsevier Patient Education  2024 [employer]  

## 2023-05-01 ENCOUNTER — Other Ambulatory Visit: Payer: Self-pay | Admitting: Physician Assistant

## 2023-05-01 DIAGNOSIS — E559 Vitamin D deficiency, unspecified: Secondary | ICD-10-CM

## 2023-05-01 DIAGNOSIS — E785 Hyperlipidemia, unspecified: Secondary | ICD-10-CM

## 2023-05-01 LAB — CMP14+EGFR
ALT: 30 [IU]/L (ref 0–44)
AST: 24 [IU]/L (ref 0–40)
Albumin: 4.6 g/dL (ref 4.1–5.1)
Alkaline Phosphatase: 73 [IU]/L (ref 44–121)
BUN/Creatinine Ratio: 21 — ABNORMAL HIGH (ref 9–20)
BUN: 15 mg/dL (ref 6–24)
Bilirubin Total: 0.4 mg/dL (ref 0.0–1.2)
CO2: 24 mmol/L (ref 20–29)
Calcium: 9.7 mg/dL (ref 8.7–10.2)
Chloride: 101 mmol/L (ref 96–106)
Creatinine, Ser: 0.7 mg/dL — ABNORMAL LOW (ref 0.76–1.27)
Globulin, Total: 2.7 g/dL (ref 1.5–4.5)
Glucose: 90 mg/dL (ref 70–99)
Potassium: 4.6 mmol/L (ref 3.5–5.2)
Sodium: 138 mmol/L (ref 134–144)
Total Protein: 7.3 g/dL (ref 6.0–8.5)
eGFR: 115 mL/min/{1.73_m2} (ref >59)

## 2023-05-01 LAB — LIPID PANEL
Chol/HDL Ratio: 5.3 ratio — ABNORMAL HIGH (ref 0.0–5.0)
Cholesterol, Total: 233 mg/dL — ABNORMAL HIGH (ref 100–199)
HDL: 44 mg/dL (ref >39)
LDL Chol Calc (NIH): 170 mg/dL — ABNORMAL HIGH (ref 0–99)
Triglycerides: 107 mg/dL (ref 0–149)
VLDL Cholesterol Cal: 19 mg/dL (ref 5–40)

## 2023-05-01 LAB — VITAMIN D 25 HYDROXY (VIT D DEFICIENCY, FRACTURES): Vit D, 25-Hydroxy: 25.6 ng/mL — ABNORMAL LOW (ref 30.0–100.0)

## 2023-05-01 MED ORDER — ROSUVASTATIN CALCIUM 20 MG PO TABS
20.0000 mg | ORAL_TABLET | Freq: Every day | ORAL | 3 refills | Status: AC
Start: 2023-05-01 — End: ?

## 2023-05-01 MED ORDER — VITAMIN D (ERGOCALCIFEROL) 1.25 MG (50000 UNIT) PO CAPS
50000.0000 [IU] | ORAL_CAPSULE | ORAL | 0 refills | Status: AC
Start: 2023-05-01 — End: ?

## 2023-05-04 ENCOUNTER — Telehealth: Payer: Self-pay

## 2023-05-04 NOTE — Telephone Encounter (Signed)
-----   Message from Georgian Co sent at 05/01/2023 12:17 PM EDT ----- Please call patient and let them that their vitamin D is low.  This can contribute to muscle aches, anxiety, fatigue, and depression.  I have sent a prescription to the pharmacy for them to take once a week.  We will recheck this level in 3-4 months.  His cholesterol is even higher than before.  He will need medications to help with this and I sent him a prescription for that as well.  Diet alone will not help cholesterol this high.  Kidneys appear as though he does not drink enough water.  Please tell him to aim to drink 80 ounces water daily.  Thanks, Georgian Co, PA-C

## 2023-05-04 NOTE — Telephone Encounter (Signed)
Pt was called and is aware of results, DOB was confirmed.  ?

## 2023-05-11 ENCOUNTER — Other Ambulatory Visit (INDEPENDENT_AMBULATORY_CARE_PROVIDER_SITE_OTHER): Payer: Self-pay

## 2023-05-11 ENCOUNTER — Ambulatory Visit (INDEPENDENT_AMBULATORY_CARE_PROVIDER_SITE_OTHER): Payer: Self-pay | Admitting: Sports Medicine

## 2023-05-11 DIAGNOSIS — R1032 Left lower quadrant pain: Secondary | ICD-10-CM

## 2023-05-11 DIAGNOSIS — M222X2 Patellofemoral disorders, left knee: Secondary | ICD-10-CM

## 2023-05-11 DIAGNOSIS — M222X1 Patellofemoral disorders, right knee: Secondary | ICD-10-CM

## 2023-05-11 DIAGNOSIS — M25562 Pain in left knee: Secondary | ICD-10-CM

## 2023-05-11 DIAGNOSIS — M25561 Pain in right knee: Secondary | ICD-10-CM

## 2023-05-11 DIAGNOSIS — G8929 Other chronic pain: Secondary | ICD-10-CM

## 2023-05-11 MED ORDER — METHYLPREDNISOLONE 4 MG PO TBPK: ORAL_TABLET | ORAL | 0 refills | Status: AC

## 2023-05-11 NOTE — Progress Notes (Signed)
Patient says that he has had pain in both knees for about one year, with left knee feeling worse than right. He says that he uses a ladder for work which is painful, as well as stairs. He describes pain as being beneath the patella. He says that he did have an injury to the left knee about 15 years ago that resolved on its own in a few months; he describes a hyperextension injury while playing soccer when another player fell onto his knee.   Patient also describes pain in his left groin for the last 6 months. He says that he feels sharp, burning pain in the one spot when trying to move his leg out, but says that this pain never shoots down the leg. Patient says that his doctor told him last year that his prostate was inflamed.

## 2023-05-11 NOTE — Progress Notes (Signed)
Allen Mendez - 47 y.o. male MRN [id number]  Date of birth: June 06, 1976  Office Visit Note: Visit Date: 05/11/2023 PCP: Storm Frisk, MD Referred by: Anders Simmonds, PA-C  Subjective: Chief Complaint  Patient presents with   Left Knee - Pain   Right Knee - Pain   Left Hip - Pain   HPI: Allen Mendez is a pleasant 47 y.o. male who presents today for bilateral knee pain, left hip/groin pain.  Bilateral knee pain -pain has been on and off for 1 year or more.  He is very active going up and down steps in a ladder for work which does cause him pain.  He points to pain being beneath the patella.  He had a hyperextension injury of the left knee about 15 years ago playing soccer but did not seek medical care nor has he had any surgeries for that knee.  Left groin -this has been bothering him for about 6 months or so.  He has a sharp and somewhat burning sensation over the anterior hip in the groin.  This does not get worse when his leg moves.  He does have a history of prostatitis.  He is not taking any medication for his pain.  He denies any numbness or tingling down the leg.  Pertinent ROS were reviewed with the patient and found to be negative unless otherwise specified above in HPI.   Assessment & Plan: Visit Diagnoses:  1. Chronic pain of left knee   2. Chronic pain of right knee   3. Left groin pain   4. Patellofemoral pain syndrome of both knees    Plan: Discussed with Allen Mendez the nature of his bilateral knee pain which seems most consistent with patellofemoral pain syndrome.  He does have some early arthritic change in the patellofemoral joint but his tibiofemoral joint spaces look excellent.  He also is dealing with some left groin pain which does not appear to be bony or arthritic in nature.  He does have some tightness of the psoas and the anterior hip musculature.  For both of these we will start him in formalized physical therapy, referral sent today.  We  will place him on a 6-day Medrol Dosepak to help with acute inflammation and pain.  Following this he may resume his meloxicam 15 mg once daily.  We will see him back in about 1 month for follow-up.  Follow-up: Return in about 1 month (around 06/10/2023) for for b/l knees, L-hip (30-mins for [place]).   Meds & Orders:  Meds ordered this encounter  Medications   methylPREDNISolone (MEDROL DOSEPAK) 4 MG TBPK tablet    Sig: Take per packet instructions. Taper dosing.    Dispense:  1 each    Refill:  0    Orders Placed This Encounter  Procedures   XR Knee Complete 4 Views Left   XR Knee Complete 4 Views Right   XR HIP UNILAT W OR W/O PELVIS 2-3 VIEWS LEFT   Ambulatory referral to Physical Therapy     Procedures: No procedures performed      Clinical History: No specialty comments available.  He reports that he has never smoked. He has never used smokeless tobacco.  Recent Labs    08/26/22 1006  HGBA1C 5.7*    Objective:    Physical Exam  Gen: Well-appearing, in no acute distress; non-toxic CV: Well-perfused. Warm.  Resp: Breathing unlabored on room air; no wheezing. Psych: Fluid speech in conversation; appropriate affect; normal thought process  Neuro: Sensation intact throughout. No gross coordination deficits.   Ortho Exam - Bilateral knees: There is no medial or lateral joint line TTP.  There is some pain with patellar compression test left greater than right.  There is mild patellar crepitus.  Range of motion from 0-135 degrees.  Patient is ligamentously intact, negative Lachman, anterior/posterior drawer.  5/5 strength about the knee in all directions.  - Left hip: No bony TTP about the hip.  There is full range of motion with internal and external logroll.  Negative FADIR test, positive FABER test and flexion of the anterior hip musculature.  Imaging: XR HIP UNILAT W OR W/O PELVIS 2-3 VIEWS LEFT  Result Date: 05/11/2023 2 views of the left hip including AP pelvis and  lateral film of the left hip was ordered and reviewed by myself.  X-ray shows preserved joint space.  No acute fracture or significant arthritic change.  XR Knee Complete 4 Views Right  Result Date: 05/11/2023 4 views of bilateral knee including AP standing, Rosenberg, lateral and sunrise view were ordered and reviewed by myself.  X-rays demonstrate preserved tricompartmental arthritic change with mild patellofemoral arthralgia.  Acute bony fracture.  In the left knee there is a somewhat spiculated bony opacity in the distal femur, which most closely resembles an enchondroma, although would need advanced imaging to fully evaluate.  XR Knee Complete 4 Views Left  Result Date: 05/11/2023 4 views of bilateral knee including AP standing, Rosenberg, lateral and sunrise view were ordered and reviewed by myself.  X-rays demonstrate preserved tricompartmental arthritic change with mild patellofemoral arthralgia.  Acute bony fracture.  In the left knee there is a somewhat spiculated bony opacity in the distal femur, which most closely resembles an enchondroma, although would need advanced imaging to fully evaluate.   CT ABDOMEN PELVIS W CONTRAST CLINICAL DATA:  47 year old male with history of acute onset of nonlocalized abdominal pain.  EXAM: CT ABDOMEN AND PELVIS WITH CONTRAST  TECHNIQUE: Multidetector CT imaging of the abdomen and pelvis was performed using the standard protocol following bolus administration of intravenous contrast.  RADIATION DOSE REDUCTION: This exam was performed according to the departmental dose-optimization program which includes automated exposure control, adjustment of the mA and/or kV according to patient size and/or use of iterative reconstruction technique.  CONTRAST:  75mL OMNIPAQUE IOHEXOL 350 MG/ML SOLN  COMPARISON:  CT of the abdomen and pelvis 02/21/2022.  FINDINGS: Lower chest: Unremarkable.  Hepatobiliary: Subcentimeter low-attenuation lesions in the  liver, too small to definitively characterize, but similar to the prior study and statistically likely to represent tiny cysts or biliary hamartomas (no imaging follow-up recommended). No other aggressive appearing hepatic lesions. No intra or extrahepatic biliary ductal dilatation. Gallbladder is unremarkable in appearance.  Pancreas: No pancreatic mass. No pancreatic ductal dilatation. No pancreatic or peripancreatic fluid collections or inflammatory changes.  Spleen: Unremarkable.  Adrenals/Urinary Tract: Bilateral kidneys and bilateral adrenal glands are normal in appearance. No hydroureteronephrosis. Urinary bladder is normal in appearance.  Stomach/Bowel: The appearance of the stomach is normal. No pathologic dilatation of small bowel or colon. Normal appendix.  Vascular/Lymphatic: No significant atherosclerotic disease, aneurysm or dissection noted in the abdominal or pelvic vasculature. No lymphadenopathy noted in the abdomen or pelvis.  Reproductive: Prostate gland and seminal vesicles are unremarkable in appearance.  Other: No significant volume of ascites.  No pneumoperitoneum.  Musculoskeletal: Soft tissue thickening and surrounding soft tissue stranding in the fat adjacent to the umbilicus. Chronic appearing compression fracture of L1 with 25%  loss of anterior vertebral body height, unchanged. There are no aggressive appearing lytic or blastic lesions noted in the visualized portions of the skeleton.  IMPRESSION: 1. Soft tissue thickening and surrounding fat stranding adjacent to the umbilicus. Clinical correlation for signs and symptoms of cellulitis in the umbilical region is recommended. 2. No other acute findings are noted elsewhere in the abdomen or pelvis to account for the patient's symptoms.  Electronically Signed   By: Trudie Reed M.D.   On: 11/08/2022 08:08    Past Medical/Family/Surgical/Social History: Medications & Allergies reviewed per  EMR, new medications updated. Patient Active Problem List   Diagnosis Date Noted   Bilirubin in urine 12/25/2022   Benign prostatic hyperplasia (BPH) with straining on urination 09/29/2022   RUQ abdominal pain 08/26/2022   Past Medical History:  Diagnosis Date   Hyperlipidemia    Polyuria 08/26/2022   Family History  Problem Relation Age of Onset   Asthma Father    Asthma Daughter    Asthma Maternal Grandmother    No past surgical history on file. Social History   Occupational History   Occupation: framer houses porches  Tobacco Use   Smoking status: Never   Smokeless tobacco: Never  Vaping Use   Vaping status: Never Used  Substance and Sexual Activity   Alcohol use: No   Drug use: No   Sexual activity: Yes

## 2023-05-14 ENCOUNTER — Ambulatory Visit: Payer: Self-pay | Admitting: Physical Therapy

## 2023-05-27 ENCOUNTER — Ambulatory Visit: Payer: Self-pay | Admitting: Physical Therapy

## 2023-06-10 ENCOUNTER — Ambulatory Visit: Payer: Self-pay | Admitting: Sports Medicine

## 2023-10-30 ENCOUNTER — Ambulatory Visit: Payer: No Typology Code available for payment source | Admitting: Nurse Practitioner

## 2023-11-20 ENCOUNTER — Encounter: Payer: Self-pay | Admitting: Nurse Practitioner

## 2023-11-20 ENCOUNTER — Other Ambulatory Visit: Payer: Self-pay

## 2023-11-20 ENCOUNTER — Ambulatory Visit: Payer: Self-pay | Attending: Nurse Practitioner | Admitting: Nurse Practitioner

## 2023-11-20 VITALS — BP 96/57 | HR 58 | Resp 19 | Ht 67.0 in | Wt 154.7 lb

## 2023-11-20 DIAGNOSIS — R7303 Prediabetes: Secondary | ICD-10-CM

## 2023-11-20 DIAGNOSIS — R1013 Epigastric pain: Secondary | ICD-10-CM

## 2023-11-20 DIAGNOSIS — E785 Hyperlipidemia, unspecified: Secondary | ICD-10-CM

## 2023-11-20 DIAGNOSIS — R3916 Straining to void: Secondary | ICD-10-CM

## 2023-11-20 DIAGNOSIS — N401 Enlarged prostate with lower urinary tract symptoms: Secondary | ICD-10-CM

## 2023-11-20 DIAGNOSIS — R1011 Right upper quadrant pain: Secondary | ICD-10-CM

## 2023-11-20 MED ORDER — TAMSULOSIN HCL 0.4 MG PO CAPS
0.4000 mg | ORAL_CAPSULE | Freq: Every day | ORAL | 1 refills | Status: AC
Start: 2023-11-20 — End: ?
  Filled 2023-11-20: qty 90, 90d supply, fill #0

## 2023-11-20 MED ORDER — PANTOPRAZOLE SODIUM 40 MG PO TBEC
40.0000 mg | DELAYED_RELEASE_TABLET | Freq: Every day | ORAL | 1 refills | Status: AC
Start: 2023-11-20 — End: ?
  Filled 2023-11-20: qty 90, 90d supply, fill #0

## 2023-11-20 NOTE — Progress Notes (Signed)
 Assessment & Plan:  [name] was seen today for abdominal pain.  Diagnoses and all orders for this visit:  RUQ abdominal pain -     CBC with Differential -     CMP14+EGFR -     H. pylori breath test  Dyspepsia -     pantoprazole  (PROTONIX ) 40 MG tablet; Take 1 tablet (40 mg total) by mouth daily before breakfast. For stomach pain -     H. pylori breath test  Benign prostatic hyperplasia (BPH) with straining on urination -     tamsulosin  (FLOMAX ) 0.4 MG CAPS capsule; Take 1 capsule (0.4 mg total) by mouth daily. For urine frequency  Prediabetes -     Hemoglobin A1c -     CMP14+EGFR  Dyslipidemia -     Lipid panel    Patient has been counseled on age-appropriate routine health concerns for screening and prevention. These are reviewed and up-to-date. Referrals have been placed accordingly. Immunizations are up-to-date or declined.    Subjective:   Chief Complaint  Patient presents with   Abdominal Pain    Right sided abdomen pain    Allen Mendez 48 y.o. male presents to office today with complaints of chronic right sided abdominal pain. He is a patient of Dr. Brent Cambric.   Right sided abdominal pain has been present on and off for over a year.  He was referred to gastroenterology by his PCP but was lost to follow-up for scheduling.  He is currently prescribed pantoprazole  but does not take this daily as instructed.  Pain is aggravated by certain liquids such as Coca-Cola.  He denies hematemesis, nausea or vomiting since right sided abdominal pain chronic.  Pain goes away on its own but last for up to 30 minutes.  Described as burning and sharp.   CT of abdomen pelvis 11/08/2022 IMPRESSION: 1. Soft tissue thickening and surrounding fat stranding adjacent to the umbilicus. Clinical correlation for signs and symptoms of cellulitis in the umbilical region is recommended. 2. No other acute findings are noted elsewhere in the abdomen or pelvis to account for the patient's  symptoms.   LUTS Was previously taking tamsulosin  which had been prescribed by his PCP whom he states diagnosed him with BPH.  He endorses urinating up to 2 times at night.  He tries to cut off his drinking or liquids by 8 PM and usually goes to bed around 11 PM.  He does not endorse any other GU symptoms today.    Review of Systems  Constitutional:  Negative for fever, malaise/fatigue and weight loss.  HENT: Negative.  Negative for nosebleeds.   Eyes: Negative.  Negative for blurred vision, double vision and photophobia.  Respiratory: Negative.  Negative for cough and shortness of breath.   Cardiovascular: Negative.  Negative for chest pain, palpitations and leg swelling.  Gastrointestinal:  Positive for abdominal pain and heartburn. Negative for blood in stool, constipation, diarrhea, melena, nausea and vomiting.  Genitourinary:  Positive for frequency. Negative for dysuria, flank pain, hematuria and urgency.  Musculoskeletal: Negative.  Negative for myalgias.  Neurological: Negative.  Negative for dizziness, focal weakness, seizures and headaches.  Psychiatric/Behavioral: Negative.  Negative for suicidal ideas.     Past Medical History:  Diagnosis Date   Hyperlipidemia    Polyuria 08/26/2022    No past surgical history on file.  Family History  Problem Relation Age of Onset   Asthma Father    Asthma Daughter    Asthma Maternal Grandmother  Social History Reviewed with no changes to be made today.   Outpatient Medications Prior to Visit  Medication Sig Dispense Refill   ipratropium (ATROVENT ) 0.06 % nasal spray Place 2 sprays into both nostrils 4 (four) times daily. (Patient not taking: Reported on 11/20/2023) 15 mL 0   meloxicam  (MOBIC ) 15 MG tablet Take 1 tablet (15 mg total) by mouth daily. Prn pain (Patient not taking: Reported on 11/20/2023) 30 tablet 2   methylPREDNISolone  (MEDROL  DOSEPAK) 4 MG TBPK tablet Take per packet instructions. Taper dosing. (Patient not  taking: Reported on 11/20/2023) 1 each 0   rosuvastatin  (CRESTOR ) 20 MG tablet Take 1 tablet (20 mg total) by mouth daily. (Patient not taking: Reported on 11/20/2023) 90 tablet 3   Vitamin D , Ergocalciferol , (DRISDOL ) 1.25 MG (50000 UNIT) CAPS capsule Take 1 capsule (50,000 Units total) by mouth every 7 (seven) days. (Patient not taking: Reported on 11/20/2023) 16 capsule 0   pantoprazole  (PROTONIX ) 40 MG tablet Take 1 tablet (40 mg total) by mouth daily. (Patient not taking: Reported on 11/20/2023) 30 tablet 3   tamsulosin  (FLOMAX ) 0.4 MG CAPS capsule Take 1 capsule (0.4 mg total) by mouth daily. (Patient not taking: Reported on 11/20/2023) 90 capsule 1   No facility-administered medications prior to visit.    No Known Allergies     Objective:    BP (!) 96/57 (BP Location: Left Arm, Patient Position: Sitting, Cuff Size: Normal)   Pulse (!) 58   Resp 19   Ht 5\' 7"  (1.702 m)   Wt 154 lb 11.2 oz (70.2 kg)   SpO2 100%   BMI 24.23 kg/m  Wt Readings from Last 3 Encounters:  11/20/23 154 lb 11.2 oz (70.2 kg)  04/30/23 162 lb 6.4 oz (73.7 kg)  12/25/22 156 lb 6.4 oz (70.9 kg)    Physical Exam Vitals and nursing note reviewed.  Constitutional:      Appearance: He is well-developed.  HENT:     Head: Normocephalic and atraumatic.  Cardiovascular:     Rate and Rhythm: Regular rhythm. Bradycardia present.     Heart sounds: Normal heart sounds. No murmur heard.    No friction rub. No gallop.  Pulmonary:     Effort: Pulmonary effort is normal. No tachypnea or respiratory distress.     Breath sounds: Normal breath sounds. No decreased breath sounds, wheezing, rhonchi or rales.  Chest:     Chest wall: No tenderness.  Abdominal:     General: Bowel sounds are normal.     Palpations: Abdomen is soft.  Musculoskeletal:        General: Normal range of motion.     Cervical back: Normal range of motion.  Skin:    General: Skin is warm and dry.  Neurological:     Mental Status: He is alert  and oriented to person, place, and time.     Coordination: Coordination normal.  Psychiatric:        Behavior: Behavior normal. Behavior is cooperative.        Thought Content: Thought content normal.        Judgment: Judgment normal.          Patient has been counseled extensively about nutrition and exercise as well as the importance of adherence with medications and regular follow-up. The patient was given clear instructions to go to ER or return to medical center if symptoms don't improve, worsen or new problems develop. The patient verbalized understanding.   Follow-up: Return if symptoms worsen or  fail to improve.   Collins Dean, FNP-BC [facility] [place], [place] [phone]   11/20/2023, 1:35 PM

## 2023-11-20 NOTE — Patient Instructions (Signed)
 Allen Mendez [address] [place], [place] [address] ?PH# [phone] ?

## 2023-11-21 ENCOUNTER — Ambulatory Visit: Payer: Self-pay | Admitting: Nurse Practitioner

## 2023-11-21 LAB — CMP14+EGFR
ALT: 21 IU/L (ref 0–44)
AST: 19 IU/L (ref 0–40)
Albumin: 4.5 g/dL (ref 4.1–5.1)
Alkaline Phosphatase: 86 IU/L (ref 44–121)
BUN/Creatinine Ratio: 19 (ref 9–20)
BUN: 16 mg/dL (ref 6–24)
Bilirubin Total: 0.4 mg/dL (ref 0.0–1.2)
CO2: 23 mmol/L (ref 20–29)
Calcium: 9.6 mg/dL (ref 8.7–10.2)
Chloride: 101 mmol/L (ref 96–106)
Creatinine, Ser: 0.85 mg/dL (ref 0.76–1.27)
Globulin, Total: 2.9 g/dL (ref 1.5–4.5)
Glucose: 89 mg/dL (ref 70–99)
Potassium: 5 mmol/L (ref 3.5–5.2)
Sodium: 138 mmol/L (ref 134–144)
Total Protein: 7.4 g/dL (ref 6.0–8.5)
eGFR: 108 mL/min/{1.73_m2} (ref >59)

## 2023-11-21 LAB — CBC WITH DIFFERENTIAL/PLATELET
Basophils Absolute: 0 10*3/uL (ref 0.0–0.2)
Basos: 1 %
EOS (ABSOLUTE): 0.1 10*3/uL (ref 0.0–0.4)
Eos: 1 %
Hematocrit: 46.8 % (ref 37.5–51.0)
Hemoglobin: 15.7 g/dL (ref 13.0–17.7)
Immature Grans (Abs): 0 10*3/uL (ref 0.0–0.1)
Immature Granulocytes: 0 %
Lymphocytes Absolute: 1.4 10*3/uL (ref 0.7–3.1)
Lymphs: 24 %
MCH: 28.9 pg (ref 26.6–33.0)
MCHC: 33.5 g/dL (ref 31.5–35.7)
MCV: 86 fL (ref 79–97)
Monocytes Absolute: 0.5 10*3/uL (ref 0.1–0.9)
Monocytes: 9 %
Neutrophils Absolute: 3.9 10*3/uL (ref 1.4–7.0)
Neutrophils: 65 %
Platelets: 294 10*3/uL (ref 150–450)
RBC: 5.44 x10E6/uL (ref 4.14–5.80)
RDW: 12.7 % (ref 11.6–15.4)
WBC: 5.9 10*3/uL (ref 3.4–10.8)

## 2023-11-21 LAB — LIPID PANEL
Chol/HDL Ratio: 5.6 ratio — ABNORMAL HIGH (ref 0.0–5.0)
Cholesterol, Total: 225 mg/dL — ABNORMAL HIGH (ref 100–199)
HDL: 40 mg/dL (ref >39)
LDL Chol Calc (NIH): 165 mg/dL — ABNORMAL HIGH (ref 0–99)
Triglycerides: 112 mg/dL (ref 0–149)
VLDL Cholesterol Cal: 20 mg/dL (ref 5–40)

## 2023-11-21 LAB — HEMOGLOBIN A1C
Est. average glucose Bld gHb Est-mCnc: 123 mg/dL
Hgb A1c MFr Bld: 5.9 % — ABNORMAL HIGH (ref 4.8–5.6)

## 2023-11-21 LAB — H. PYLORI BREATH COLLECTION

## 2023-11-21 LAB — H. PYLORI BREATH TEST: H pylori Breath Test: NEGATIVE

## 2024-02-02 ENCOUNTER — Other Ambulatory Visit: Payer: Self-pay

## 2024-02-02 DIAGNOSIS — Z1211 Encounter for screening for malignant neoplasm of colon: Secondary | ICD-10-CM

## 2024-03-25 ENCOUNTER — Ambulatory Visit: Payer: Self-pay

## 2024-03-25 LAB — FECAL OCCULT BLOOD, IMMUNOCHEMICAL: Fecal Occult Bld: NEGATIVE

## 2024-03-25 LAB — SPECIMEN STATUS REPORT
